# Patient Record
Sex: Female | Born: 1940 | Race: White | Hispanic: No | Smoking: Never smoker
Health system: Southern US, Community
[De-identification: ages and names within clinical notes are randomized; demographics above are authoritative.]

## PROBLEM LIST (undated history)

## (undated) DIAGNOSIS — I4891 Unspecified atrial fibrillation: Secondary | ICD-10-CM

## (undated) DIAGNOSIS — M47812 Spondylosis without myelopathy or radiculopathy, cervical region: Secondary | ICD-10-CM

## (undated) DIAGNOSIS — G4700 Insomnia, unspecified: Secondary | ICD-10-CM

## (undated) DIAGNOSIS — G250 Essential tremor: Secondary | ICD-10-CM

## (undated) DIAGNOSIS — M1990 Unspecified osteoarthritis, unspecified site: Secondary | ICD-10-CM

## (undated) DIAGNOSIS — K429 Umbilical hernia without obstruction or gangrene: Secondary | ICD-10-CM

## (undated) DIAGNOSIS — M797 Fibromyalgia: Secondary | ICD-10-CM

## (undated) DIAGNOSIS — M5030 Other cervical disc degeneration, unspecified cervical region: Secondary | ICD-10-CM

## (undated) DIAGNOSIS — I872 Venous insufficiency (chronic) (peripheral): Secondary | ICD-10-CM

## (undated) DIAGNOSIS — Z7901 Long term (current) use of anticoagulants: Secondary | ICD-10-CM

## (undated) DIAGNOSIS — I10 Essential (primary) hypertension: Secondary | ICD-10-CM

## (undated) HISTORY — DX: Fibromyalgia: M79.7

## (undated) HISTORY — PX: INGUINAL HERNIA REPAIR: SUR1180

## (undated) HISTORY — DX: Long term (current) use of anticoagulants: Z79.01

## (undated) HISTORY — DX: Other cervical disc degeneration, unspecified cervical region: M50.30

## (undated) HISTORY — DX: Insomnia, unspecified: G47.00

## (undated) HISTORY — DX: Venous insufficiency (chronic) (peripheral): I87.2

## (undated) HISTORY — DX: Essential (primary) hypertension: I10

## (undated) HISTORY — DX: Unspecified atrial fibrillation: I48.91

## (undated) HISTORY — DX: Spondylosis without myelopathy or radiculopathy, cervical region: M47.812

## (undated) HISTORY — DX: Umbilical hernia without obstruction or gangrene: K42.9

## (undated) HISTORY — PX: PARTIAL HYSTERECTOMY: SHX80

## (undated) HISTORY — DX: Essential tremor: G25.0

## (undated) HISTORY — DX: Morbid (severe) obesity due to excess calories: E66.01

## (undated) HISTORY — DX: Unspecified osteoarthritis, unspecified site: M19.90

---

## 2014-04-16 ENCOUNTER — Other Ambulatory Visit (HOSPITAL_COMMUNITY): Payer: Self-pay | Admitting: Radiology

## 2014-04-16 ENCOUNTER — Ambulatory Visit (HOSPITAL_COMMUNITY)
Admission: RE | Admit: 2014-04-16 | Discharge: 2014-04-16 | Disposition: A | Discharge status: Home / Self Care | Payer: Medicare (Managed Care) | Source: Ambulatory Visit | Attending: Family Medicine | Admitting: Family Medicine

## 2014-04-16 DIAGNOSIS — E662 Morbid (severe) obesity with alveolar hypoventilation: Secondary | ICD-10-CM

## 2014-04-16 DIAGNOSIS — IMO0002 Reserved for concepts with insufficient information to code with codable children: Secondary | ICD-10-CM

## 2014-04-16 DIAGNOSIS — I272 Other secondary pulmonary hypertension: Secondary | ICD-10-CM | POA: Insufficient documentation

## 2014-04-16 DIAGNOSIS — G4700 Insomnia, unspecified: Secondary | ICD-10-CM

## 2014-04-16 DIAGNOSIS — G4730 Sleep apnea, unspecified: Secondary | ICD-10-CM | POA: Diagnosis not present

## 2014-04-16 DIAGNOSIS — G4709 Other insomnia: Secondary | ICD-10-CM | POA: Diagnosis not present

## 2014-04-16 LAB — BLOOD GAS, ARTERIAL
Acid-Base Excess: 6.6 mmol/L — ABNORMAL HIGH (ref 0.0–2.0)
Bicarbonate: 30.7 mEq/L — ABNORMAL HIGH (ref 20.0–24.0)
Drawn by: 20517
FIO2: 0.21 %
O2 Saturation: 95 %
Patient temperature: 98.6
TCO2: 32 mmol/L (ref 0–100)
pCO2 arterial: 44.9 mmHg (ref 35.0–45.0)
pH, Arterial: 7.449 (ref 7.350–7.450)
pO2, Arterial: 72.3 mmHg — ABNORMAL LOW (ref 80.0–100.0)

## 2014-04-19 ENCOUNTER — Other Ambulatory Visit: Payer: Self-pay | Admitting: Family Medicine

## 2014-04-19 DIAGNOSIS — Z1231 Encounter for screening mammogram for malignant neoplasm of breast: Secondary | ICD-10-CM

## 2014-05-04 ENCOUNTER — Ambulatory Visit: Payer: Medicare (Managed Care)

## 2014-07-09 ENCOUNTER — Ambulatory Visit
Admission: RE | Admit: 2014-07-09 | Discharge: 2014-07-09 | Disposition: A | Discharge status: Home / Self Care | Payer: No Typology Code available for payment source | Source: Ambulatory Visit | Attending: Family Medicine | Admitting: Family Medicine

## 2014-07-09 DIAGNOSIS — Z1231 Encounter for screening mammogram for malignant neoplasm of breast: Secondary | ICD-10-CM

## 2015-05-17 ENCOUNTER — Ambulatory Visit (HOSPITAL_BASED_OUTPATIENT_CLINIC_OR_DEPARTMENT_OTHER): Payer: Medicare (Managed Care) | Attending: Nurse Practitioner | Admitting: Radiology

## 2015-05-17 VITALS — Ht 64.5 in | Wt 255.0 lb

## 2015-05-17 DIAGNOSIS — G4719 Other hypersomnia: Secondary | ICD-10-CM | POA: Insufficient documentation

## 2015-05-17 DIAGNOSIS — G4700 Insomnia, unspecified: Secondary | ICD-10-CM | POA: Diagnosis not present

## 2015-05-17 DIAGNOSIS — Z6841 Body Mass Index (BMI) 40.0 and over, adult: Secondary | ICD-10-CM | POA: Insufficient documentation

## 2015-05-17 DIAGNOSIS — E669 Obesity, unspecified: Secondary | ICD-10-CM | POA: Diagnosis not present

## 2015-05-17 DIAGNOSIS — R0683 Snoring: Secondary | ICD-10-CM | POA: Diagnosis not present

## 2015-05-17 DIAGNOSIS — Z79899 Other long term (current) drug therapy: Secondary | ICD-10-CM | POA: Diagnosis not present

## 2015-05-17 DIAGNOSIS — I493 Ventricular premature depolarization: Secondary | ICD-10-CM | POA: Insufficient documentation

## 2015-05-17 DIAGNOSIS — Z7901 Long term (current) use of anticoagulants: Secondary | ICD-10-CM | POA: Insufficient documentation

## 2015-05-22 DIAGNOSIS — G4700 Insomnia, unspecified: Secondary | ICD-10-CM | POA: Diagnosis not present

## 2015-05-22 NOTE — Progress Notes (Signed)
  Patient Name: Misty Buckley, Snow Study Date: 05/17/2015 Gender: Female D.O.B: 08-22-1940 Age (years): 74 Referring Provider: Estevan OaksBeverly Ann Brown Height (inches): 65 Interpreting Physician: Jetty Duhamellinton Young MD, ABSM Weight (lbs): 255 RPSGT: Ulyess MortSpruill, Vicki BMI: 43 MRN: [id number] Neck Size: 15.00 CLINICAL INFORMATION Sleep Study Type: NPSG Indication for sleep study: Excessive Daytime Sleepiness, Obesity, Snoring Epworth Sleepiness Score: 12  SLEEP STUDY TECHNIQUE As per the AASM Manual for the Scoring of Sleep and Associated Events v2.3 (April 2016) with a hypopnea requiring 4% desaturations. The channels recorded and monitored were frontal, central and occipital EEG, electrooculogram (EOG), submentalis EMG (chin), nasal and oral airflow, thoracic and abdominal wall motion, anterior tibialis EMG, snore microphone, electrocardiogram, and pulse oximetry.  MEDICATIONS Patient's medications include: CHARTED FOR REVIEW Medications self-administered by patient during sleep study : DULOXETINE, METOPROLOL, SIMVASTATIN, xarelto, MELATONIN.  SLEEP ARCHITECTURE The study was initiated at 10:32:56 PM and ended at 5:07:33 AM. Sleep onset time was 5.0 minutes and the sleep efficiency was 89.2%. The total sleep time was 352.0 minutes. Stage REM latency was 289.5 minutes. The patient spent 1.28% of the night in stage N1 sleep, 68.89% in stage N2 sleep, 4.83% in stage N3 and 25.00% in REM. Alpha intrusion was absent. Supine sleep was 49.01%.  RESPIRATORY PARAMETERS The overall apnea/hypopnea index (AHI) was 2.9 per hour. There were 13 total apneas, including 13 obstructive, 0 central and 0 mixed apneas. There were 4 hypopneas and 50 RERAs. The AHI during Stage REM sleep was 1.4 per hour. AHI while supine was 4.5 per hour. The mean oxygen saturation was 97.47%. The minimum SpO2 during sleep was 92.00%.  Studied on O2 3L as at home, per protocol Soft snoring was noted during this study.  CARDIAC  DATA The 2 lead EKG demonstrated sinus rhythm. The mean heart rate was 78.01 beats per minute. Other EKG findings include: None.  LEG MOVEMENT DATA The total PLMS were 0 with a resulting PLMS index of 0.00. Associated arousal with leg movement index was 0.0 .  IMPRESSIONS - No significant obstructive sleep apnea occurred during this study (AHI = 2.9/h). - No significant central sleep apnea occurred during this study (CAI = 0.0/h). - The patient had minimal or no oxygen desaturation during the study (Min O2 = 92.00%), wearing O2 3L as at home - The patient snored with Soft snoring volume. - No cardiac abnormalities were noted during this study. Occasional PVC - Clinically significant periodic limb movements did not occur during sleep. No significant associated arousals. - Patient slept in recliner chair similar to home use of lift chair with head elevated.  DIAGNOSIS - Normal study  RECOMMENDATIONS - Avoid alcohol, sedatives and other CNS depressants that may worsen sleep apnea and disrupt normal sleep architecture. - Sleep hygiene should be reviewed to assess factors that may improve sleep quality. - Weight management and regular exercise should be initiated or continued if appropriate.  Waymon BudgeYOUNG,CLINTON D Diplomate, American Board of Sleep Medicine  ELECTRONICALLY SIGNED ON:  05/22/2015, 3:11 PM [facility] PH: [phone]   FX: [phone] ACCREDITED BY THE AMERICAN ACADEMY OF SLEEP MEDICINE

## 2015-06-10 ENCOUNTER — Ambulatory Visit (INDEPENDENT_AMBULATORY_CARE_PROVIDER_SITE_OTHER): Payer: Medicare (Managed Care) | Admitting: Internal Medicine

## 2015-06-10 ENCOUNTER — Encounter: Payer: Self-pay | Admitting: Internal Medicine

## 2015-06-10 ENCOUNTER — Encounter (HOSPITAL_COMMUNITY): Payer: Self-pay

## 2015-06-10 ENCOUNTER — Telehealth: Payer: Self-pay

## 2015-06-10 ENCOUNTER — Inpatient Hospital Stay (HOSPITAL_COMMUNITY)
Admission: AD | Admit: 2015-06-10 | Discharge: 2015-06-12 | DRG: 394 | Disposition: A | Discharge status: Skilled Nursing Facility | Payer: Medicare (Managed Care) | Source: Ambulatory Visit | Attending: Oncology | Admitting: Oncology

## 2015-06-10 VITALS — BP 114/60 | HR 104

## 2015-06-10 DIAGNOSIS — M797 Fibromyalgia: Secondary | ICD-10-CM | POA: Diagnosis present

## 2015-06-10 DIAGNOSIS — F419 Anxiety disorder, unspecified: Secondary | ICD-10-CM | POA: Diagnosis present

## 2015-06-10 DIAGNOSIS — I83009 Varicose veins of unspecified lower extremity with ulcer of unspecified site: Secondary | ICD-10-CM | POA: Diagnosis present

## 2015-06-10 DIAGNOSIS — I10 Essential (primary) hypertension: Secondary | ICD-10-CM | POA: Diagnosis not present

## 2015-06-10 DIAGNOSIS — K449 Diaphragmatic hernia without obstruction or gangrene: Secondary | ICD-10-CM | POA: Diagnosis present

## 2015-06-10 DIAGNOSIS — K649 Unspecified hemorrhoids: Secondary | ICD-10-CM | POA: Diagnosis not present

## 2015-06-10 DIAGNOSIS — I480 Paroxysmal atrial fibrillation: Secondary | ICD-10-CM | POA: Diagnosis present

## 2015-06-10 DIAGNOSIS — I5032 Chronic diastolic (congestive) heart failure: Secondary | ICD-10-CM | POA: Diagnosis present

## 2015-06-10 DIAGNOSIS — K625 Hemorrhage of anus and rectum: Secondary | ICD-10-CM | POA: Diagnosis present

## 2015-06-10 DIAGNOSIS — E785 Hyperlipidemia, unspecified: Secondary | ICD-10-CM | POA: Diagnosis present

## 2015-06-10 DIAGNOSIS — G4700 Insomnia, unspecified: Secondary | ICD-10-CM | POA: Diagnosis present

## 2015-06-10 DIAGNOSIS — K429 Umbilical hernia without obstruction or gangrene: Secondary | ICD-10-CM | POA: Diagnosis present

## 2015-06-10 DIAGNOSIS — Z79899 Other long term (current) drug therapy: Secondary | ICD-10-CM

## 2015-06-10 DIAGNOSIS — I110 Hypertensive heart disease with heart failure: Secondary | ICD-10-CM | POA: Diagnosis present

## 2015-06-10 DIAGNOSIS — R32 Unspecified urinary incontinence: Secondary | ICD-10-CM | POA: Diagnosis present

## 2015-06-10 DIAGNOSIS — I872 Venous insufficiency (chronic) (peripheral): Secondary | ICD-10-CM | POA: Diagnosis present

## 2015-06-10 DIAGNOSIS — Z9981 Dependence on supplemental oxygen: Secondary | ICD-10-CM

## 2015-06-10 DIAGNOSIS — K921 Melena: Secondary | ICD-10-CM | POA: Diagnosis not present

## 2015-06-10 DIAGNOSIS — Z888 Allergy status to other drugs, medicaments and biological substances status: Secondary | ICD-10-CM | POA: Diagnosis not present

## 2015-06-10 DIAGNOSIS — K648 Other hemorrhoids: Principal | ICD-10-CM | POA: Diagnosis present

## 2015-06-10 DIAGNOSIS — F329 Major depressive disorder, single episode, unspecified: Secondary | ICD-10-CM | POA: Diagnosis present

## 2015-06-10 DIAGNOSIS — Z7901 Long term (current) use of anticoagulants: Secondary | ICD-10-CM

## 2015-06-10 DIAGNOSIS — I4891 Unspecified atrial fibrillation: Secondary | ICD-10-CM | POA: Diagnosis present

## 2015-06-10 DIAGNOSIS — I071 Rheumatic tricuspid insufficiency: Secondary | ICD-10-CM | POA: Diagnosis present

## 2015-06-10 DIAGNOSIS — K5791 Diverticulosis of intestine, part unspecified, without perforation or abscess with bleeding: Secondary | ICD-10-CM | POA: Diagnosis not present

## 2015-06-10 DIAGNOSIS — I272 Other secondary pulmonary hypertension: Secondary | ICD-10-CM | POA: Diagnosis present

## 2015-06-10 DIAGNOSIS — K5730 Diverticulosis of large intestine without perforation or abscess without bleeding: Secondary | ICD-10-CM | POA: Diagnosis present

## 2015-06-10 DIAGNOSIS — G4730 Sleep apnea, unspecified: Secondary | ICD-10-CM | POA: Diagnosis present

## 2015-06-10 DIAGNOSIS — G250 Essential tremor: Secondary | ICD-10-CM | POA: Diagnosis present

## 2015-06-10 DIAGNOSIS — L97819 Non-pressure chronic ulcer of other part of right lower leg with unspecified severity: Secondary | ICD-10-CM | POA: Diagnosis present

## 2015-06-10 DIAGNOSIS — L8990 Pressure ulcer of unspecified site, unspecified stage: Secondary | ICD-10-CM | POA: Diagnosis present

## 2015-06-10 LAB — COMPREHENSIVE METABOLIC PANEL
ALBUMIN: 3.3 g/dL — AB (ref 3.5–5.0)
ALT: 11 U/L — ABNORMAL LOW (ref 14–54)
AST: 22 U/L (ref 15–41)
Alkaline Phosphatase: 73 U/L (ref 38–126)
Anion gap: 9 (ref 5–15)
BUN: 10 mg/dL (ref 6–20)
CHLORIDE: 96 mmol/L — AB (ref 101–111)
CO2: 35 mmol/L — ABNORMAL HIGH (ref 22–32)
Calcium: 9 mg/dL (ref 8.9–10.3)
Creatinine, Ser: 0.86 mg/dL (ref 0.44–1.00)
GFR calc Af Amer: >60 mL/min (ref >60)
Glucose, Bld: 114 mg/dL — ABNORMAL HIGH (ref 65–99)
POTASSIUM: 4 mmol/L (ref 3.5–5.1)
Sodium: 140 mmol/L (ref 135–145)
Total Bilirubin: 1 mg/dL (ref 0.3–1.2)
Total Protein: 6.6 g/dL (ref 6.5–8.1)

## 2015-06-10 LAB — CBC
HEMATOCRIT: 45.3 % (ref 36.0–46.0)
Hemoglobin: 13.5 g/dL (ref 12.0–15.0)
MCH: 29.6 pg (ref 26.0–34.0)
MCHC: 29.8 g/dL — AB (ref 30.0–36.0)
MCV: 99.3 fL (ref 78.0–100.0)
Platelets: 128 10*3/uL — ABNORMAL LOW (ref 150–400)
RBC: 4.56 MIL/uL (ref 3.87–5.11)
RDW: 14.1 % (ref 11.5–15.5)
WBC: 11.2 10*3/uL — ABNORMAL HIGH (ref 4.0–10.5)

## 2015-06-10 LAB — ABO/RH: ABO/RH(D): A POS

## 2015-06-10 LAB — PREPARE RBC (CROSSMATCH)

## 2015-06-10 LAB — PROTIME-INR
INR: 1.45 (ref 0.00–1.49)
Prothrombin Time: 17.7 seconds — ABNORMAL HIGH (ref 11.6–15.2)

## 2015-06-10 MED ORDER — POTASSIUM CHLORIDE 20 MEQ PO PACK
20.0000 meq | PACK | Freq: Two times a day (BID) | ORAL | Status: DC
  Administered 2015-06-10 – 2015-06-11 (×3): 20 meq via ORAL
  Filled 2015-06-10 (×4): qty 1

## 2015-06-10 MED ORDER — MELATONIN 5 MG PO TABS: 1.0000 | ORAL_TABLET | Freq: Every day | ORAL | Status: DC

## 2015-06-10 MED ORDER — FUROSEMIDE 80 MG PO TABS
80.0000 mg | ORAL_TABLET | Freq: Two times a day (BID) | ORAL | Status: DC
  Administered 2015-06-11 – 2015-06-12 (×3): 80 mg via ORAL
  Filled 2015-06-10 (×3): qty 1

## 2015-06-10 MED ORDER — POLYETHYLENE GLYCOL 3350 17 G PO PACK
17.0000 g | PACK | Freq: Every day | ORAL | Status: DC
  Administered 2015-06-10: 17 g via ORAL
  Filled 2015-06-10: qty 1

## 2015-06-10 MED ORDER — BENEFIBER PO POWD: 1.0000 | Freq: Every day | ORAL | Status: DC

## 2015-06-10 MED ORDER — PEG-KCL-NACL-NASULF-NA ASC-C 100 G PO SOLR
0.5000 | Freq: Once | ORAL | Status: AC
  Administered 2015-06-10: 100 g via ORAL
  Filled 2015-06-10: qty 1

## 2015-06-10 MED ORDER — PEG-KCL-NACL-NASULF-NA ASC-C 100 G PO SOLR
0.5000 | Freq: Once | ORAL | Status: AC
  Administered 2015-06-11: 100 g via ORAL
  Filled 2015-06-10: qty 1

## 2015-06-10 MED ORDER — PANTOPRAZOLE SODIUM 40 MG IV SOLR
40.0000 mg | Freq: Two times a day (BID) | INTRAVENOUS | Status: DC
  Administered 2015-06-10 – 2015-06-12 (×4): 40 mg via INTRAVENOUS
  Filled 2015-06-10 (×4): qty 40

## 2015-06-10 MED ORDER — PEG-KCL-NACL-NASULF-NA ASC-C 100 G PO SOLR: 1.0000 | Freq: Once | ORAL | Status: DC

## 2015-06-10 MED ORDER — CALCIUM CARBONATE-VITAMIN D 500-200 MG-UNIT PO TABS
1.0000 | ORAL_TABLET | Freq: Every day | ORAL | Status: DC
  Administered 2015-06-11 – 2015-06-12 (×2): 1 via ORAL
  Filled 2015-06-10 (×2): qty 1

## 2015-06-10 MED ORDER — METOPROLOL SUCCINATE ER 50 MG PO TB24
50.0000 mg | ORAL_TABLET | Freq: Every day | ORAL | Status: DC
  Administered 2015-06-11 – 2015-06-12 (×2): 50 mg via ORAL
  Filled 2015-06-10 (×2): qty 1

## 2015-06-10 MED ORDER — DULOXETINE HCL 30 MG PO CPEP
30.0000 mg | ORAL_CAPSULE | Freq: Every day | ORAL | Status: DC
  Administered 2015-06-11 – 2015-06-12 (×2): 30 mg via ORAL
  Filled 2015-06-10 (×2): qty 1

## 2015-06-10 MED ORDER — HYDROCODONE-ACETAMINOPHEN 5-325 MG PO TABS
1.0000 | ORAL_TABLET | Freq: Four times a day (QID) | ORAL | Status: DC | PRN
  Administered 2015-06-11 – 2015-06-12 (×2): 1 via ORAL
  Filled 2015-06-10 (×2): qty 1

## 2015-06-10 MED ORDER — ESTRADIOL 0.1 MG/GM VA CREA
1.0000 | TOPICAL_CREAM | Freq: Every day | VAGINAL | Status: DC
  Administered 2015-06-10 – 2015-06-11 (×2): 1 via VAGINAL
  Filled 2015-06-10: qty 42.5

## 2015-06-10 MED ORDER — METOLAZONE 2.5 MG PO TABS
2.5000 mg | ORAL_TABLET | Freq: Every day | ORAL | Status: DC
  Administered 2015-06-11 – 2015-06-12 (×2): 2.5 mg via ORAL
  Filled 2015-06-10 (×4): qty 1

## 2015-06-10 MED ORDER — SODIUM CHLORIDE 0.9% FLUSH
3.0000 mL | Freq: Two times a day (BID) | INTRAVENOUS | Status: DC
  Administered 2015-06-10 – 2015-06-12 (×4): 3 mL via INTRAVENOUS

## 2015-06-10 MED ORDER — ADULT MULTIVITAMIN W/MINERALS CH
1.0000 | ORAL_TABLET | Freq: Every day | ORAL | Status: DC
  Administered 2015-06-11 – 2015-06-12 (×2): 1 via ORAL
  Filled 2015-06-10 (×3): qty 1

## 2015-06-10 MED ORDER — CALCIUM CITRATE-VITAMIN D 200-250 MG-UNIT PO TABS: 1.0000 | ORAL_TABLET | Freq: Every day | ORAL | Status: DC

## 2015-06-10 MED ORDER — SIMVASTATIN 20 MG PO TABS
20.0000 mg | ORAL_TABLET | Freq: Every day | ORAL | Status: DC
  Administered 2015-06-10 – 2015-06-11 (×2): 20 mg via ORAL
  Filled 2015-06-10 (×2): qty 1

## 2015-06-10 MED ORDER — ALENDRONATE SODIUM 70 MG PO TABS: 70.0000 mg | ORAL_TABLET | ORAL | Status: DC

## 2015-06-10 MED ORDER — ALPRAZOLAM 0.5 MG PO TABS: 0.5000 mg | ORAL_TABLET | Freq: Every evening | ORAL | Status: DC | PRN

## 2015-06-10 MED ORDER — CETYLPYRIDINIUM CHLORIDE 0.05 % MT LIQD
7.0000 mL | Freq: Two times a day (BID) | OROMUCOSAL | Status: DC
  Administered 2015-06-10 – 2015-06-11 (×3): 7 mL via OROMUCOSAL

## 2015-06-10 MED ORDER — SENNA 8.6 MG PO TABS: 2.0000 | ORAL_TABLET | Freq: Every day | ORAL | Status: DC | PRN

## 2015-06-10 NOTE — Patient Instructions (Signed)
[facility] while awaiting a bed for direct admission by Dr. Joseph ArtWoods.

## 2015-06-10 NOTE — Telephone Encounter (Signed)
Per Dr. Adine MaduraKohler's office pt was seen today in their office. States colon may need to be diagnostic. Pt has constipation and told them she had been passing blood clots since the first of the week. He is stopping her xeralto and vesicare today. State they will send and updated med list with her to the appt today. Dr. Marina GoodellPerry aware.

## 2015-06-10 NOTE — Progress Notes (Signed)
HISTORY OF PRESENT ILLNESS:  Misty Buckley is a 75 y.o. 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 The patient is new to this office and sent this afternoon by her PCP Dr[facility] Dor[facility]hler at [facility],  with new onset rectal bleeding this week. She describes significant red blood and clots. She has had some generalized lower abdominal discomfort. Some nausea but no vomiting. She does report chronic GERD but is not on PPI. Multiple medications as listed. Saw her PCP today. Encounter reviewed. Labs reviewed. Rectal exam at that time revealed red blood clots. Her xeralto was stopped. Evaluation in this office this afternoon arranged. Outside laboratories from earlier today have been received and reviewed. Blood cell count 14.1, hemoglobin 13.3, platelets 141,000. The patient's thinks that she may have had prior endoscopic procedures, but if so this has been decades and there are no details.  REVIEW OF SYSTEMS:  All non-GI ROS negative except for shortness of breath, back pain, hip pain, arthritis, lower extremity edema, neck pain,  Past Medical History  Diagnosis Date  . Morbid obesity (HCC)   . Hypertension   . A-fib (HCC)   . Chronic anticoagulation   . Osteoarthritis   . Cervical  spondylosis   . DDD (degenerative disc disease), cervical   . Fibromyalgia   . Chronic venous insufficiency   . Umbilical hernia   . Benign essential tremor   . Insomnia     Past Surgical History  Procedure Laterality Date  . Partial hysterectomy    . Inguinal hernia repair Right     Social History Misty Humanthel M Totherow  reports that she has never smoked. She has never used smokeless tobacco. She reports that she does not drink alcohol or use illicit drugs.  family history includes Diabetes in her mother; Heart attack in her brother; Heart attack (age of onset: 6557) in her father.  Allergies  Allergen Reactions  . Risperidone And Related        PHYSICAL EXAMINATION: Vital signs: BP 114/60 mmHg  Pulse 104  Ht   Wt   Constitutional: Pleasant, markedly obese, chronically ill and unhealthy appearing, no acute distress but extremely limited mobility Psychiatric: alert and oriented x3, cooperative but intermittent problems with memory recall Eyes: extraocular movements intact, anicteric, conjunctiva pink Mouth: oral pharynx moist, no lesions Neck: supple but thick Lymph: no lymphadenopathy Cardiovascular: heart regular rate and rhythm, soft systolic with distant breath sounds murmur Lungs: clear to auscultation bilaterally Abdomen: soft, markedly obese, nontender, nondistended, no obvious ascites, no peritoneal signs, normal bowel sounds, no organomegaly. Umbilical hernia Rectal: No external abnormalities. No tenderness. No fissure. No internal mass. Cherry red blood on the examining finger Extremities: No clubbing or cyanosis. Bilateral lower extremities are wrapped with bandages with obvious woody edema Skin: no relevant lesions on poor so or upper extremities. Lower extremities as described Neuro: No focal deficits. Cranial nerves intact. No asterixis.  ASSESSMENT:  #1. Rectal bleeding on chronic anticoagulation therapy. Etiology unclear. Hemodynamically stable. Hemoglobin  earlier  today acceptable #2. History of atrial fibrillation on Xarelto. Just stopped #3. MULTIPLE MULTIPLE significant medical problems. Impossibility to workup her rectal bleeding as an outpatient. She is high risk for procedural work under the best of circumstances. Needs hospitalized for further assessment and evaluation   PLAN:  #1. Admit to the hospital. Dr. Carolyne Littles has kindly agreed to facilitate her admission [facility] #2. GI aware of patient (Dr. Adela Lank). Will need evaluation. May be best to start with flexible sigmoidoscopy to minimize risk.  Discussed with patient. Message left with PCP Dr. Dorothe Pea

## 2015-06-10 NOTE — Telephone Encounter (Signed)
-----   Message from Jessee AversAmanda L Lewellyn, [place] sent at 06/10/2015 11:57 AM EDT -----   ----- Message -----    From: Holli Humbleshristy D Harris    Sent: 06/10/2015  11:47 AM      To: Jessee AversAmanda L Lewellyn, CMA  Pt has an appt today @ 3:00 with Dr. Rossie MuskratPerry  Jackie @ Dr. Malon KindleKoehler's office called and wants to discuss before her appt her symptoms  #[phone]

## 2015-06-10 NOTE — Progress Notes (Signed)
Pt. Has arrived to, and is being orieinted to [address]. Pt. BP, pulse and temperature within normal limits. Oxygen was low at 77%, pt. Placed on 2L nasal cannula and oxygen saturations are now at 93%.

## 2015-06-10 NOTE — H&P (Addendum)
[facility] History and Physical  Misty Buckley ZOX:[id number] DOB: [birth year] DOA: 06/10/2015  Referring physician: Dr Yancey Flemings PCP: Thane Edu, MD   Chief Complaint:   rectal bleed x 2 weeks  HPI:  75 year old morbidly obese female with multiple comorbidities (obtained from patient's sister and Dr. Lamar Sprinkles note) which includes pulmonary hypertension on intermittent oxygen, exertional dyspnea, left diaphragmatic hernia, hypertension, diastolic CHF, tricuspid insufficiency, hyperlipidemia, paroxysmal A. fib on Xarelto, osteoarthritis, cervical spondylosis with radiculopathy,, chronic venous insufficiency with lymphedema and recurrent cellulitis, venous stasis ulcers on right shin, urinary incontinence, umbilical hernia who was referred by the PCP at [facility] program to rule out GI for ongoing rectal bleed for 2 weeks. As per patient she has been constipated off and on for 2 weeks but having bright red blood per rectum, occasionally with clots on a regular basis for past 2 weeks. She has been continuing anticoagulation. Patient also having generalized dull lower abdominal discomfort. Denies any headache, dizziness, syncope, fever, chills, nausea, vomiting, chest pain, palpitations, worsening shortness of breath, dysuria. Denies use of NSAIDs over-the-counter. Denies any new medications. No change in weight or appetite. Patient lives alone and ambulates with the help of a walker. Patient was hospitalized in January for cellulitis at [facility] and was admitted to rehabilitation at [facility] early this month by her PCP for generalized weakness.  Rectal exam done in  the office showed red blood clots.  Dr. Marina Goodell requested a hospitalist to have patient directly admitted given her rectal bleed and multiple comorbidities to have inpatient workup done. Per his note patient had lab work done at PCPs office today which showed WC of 14.1 K, Reglan 13.3 and platelets of  141K. I do not have any other lab work or notes available.   Review of Systems:  As outlined in history of present illness. 12 point review of systems otherwise unremarkable.    Past Medical History  Diagnosis Date  . Morbid obesity (HCC)   . Hypertension   . A-fib (HCC)   . Chronic anticoagulation   . Osteoarthritis   . Cervical spondylosis   . DDD (degenerative disc disease), cervical   . Fibromyalgia   . Chronic venous insufficiency   . Umbilical hernia   . Benign essential tremor   . Insomnia    Past Surgical History  Procedure Laterality Date  . Partial hysterectomy    . Inguinal hernia repair Right    Social History:  reports that she has never smoked. She has never used smokeless tobacco. She reports that she does not drink alcohol or use illicit drugs.  Allergies  Allergen Reactions  . Risperidone And Related     Family History  Problem Relation Age of Onset  . Heart attack Father 61    deceased  . Heart attack Brother   . Diabetes Mother     Prior to Admission medications   Medication Sig Start Date End Date Taking? Authorizing Provider  alendronate (FOSAMAX) 70 MG tablet Take 70 mg by mouth once a week. Take with a full glass of water on an empty stomach.    Historical Provider, MD  ALPRAZolam Prudy Feeler) 0.5 MG tablet Take 0.5 mg by mouth at bedtime as needed for anxiety.    Historical Provider, MD  bag balm OINT ointment Apply 1 application topically as needed for dry skin.    Historical Provider, MD  Calcium Citrate-Vitamin D (CITRACAL PETITES/VITAMIN D PO) Take by mouth.    Historical Provider, MD  DULoxetine (CYMBALTA) 30 MG capsule Take 30 mg by mouth daily.    Historical Provider, MD  DULoxetine (CYMBALTA) 60 MG capsule Take 60 mg by mouth daily.    Historical Provider, MD  estradiol (ESTRACE) 0.1 MG/GM vaginal cream Place 1 Applicatorful vaginally at bedtime.    Historical Provider, MD  fluocinonide cream (LIDEX) 0.05 % Apply 1 application topically 2  (two) times daily.    Historical Provider, MD  furosemide (LASIX) 80 MG tablet Take 80 mg by mouth 2 (two) times daily.    Historical Provider, MD  HYDROcodone-acetaminophen (NORCO/VICODIN) 5-325 MG tablet Take 1 tablet by mouth every 6 (six) hours as needed for moderate pain. 1 tab in the am, 1/2 at noon, 1/2 in pm, 1/2 at bedtime    Historical Provider, MD  Melatonin 5 MG TABS Take 1 tablet by mouth daily.    Historical Provider, MD  metolazone (ZAROXOLYN) 2.5 MG tablet Take 2.5 mg by mouth daily.    Historical Provider, MD  metoprolol succinate (TOPROL-XL) 50 MG 24 hr tablet Take 50 mg by mouth daily. Take with or immediately following a meal.    Historical Provider, MD  Multiple Vitamin (MULTIVITAMIN) tablet Take 1 tablet by mouth daily.    Historical Provider, MD  OXYGEN Inhale 2 L into the lungs as needed.    Historical Provider, MD  potassium chloride (KLOR-CON) 20 MEQ packet Take 20 mEq by mouth 2 (two) times daily.    Historical Provider, MD  senna (SENOKOT) 8.6 MG TABS tablet Take 2 tablets by mouth.    Historical Provider, MD  simvastatin (ZOCOR) 20 MG tablet Take 20 mg by mouth daily.    Historical Provider, MD  Wheat Dextrin (BENEFIBER PO) Take by mouth.    Historical Provider, MD     Physical Exam:  There were no vitals filed for this visit.  Constitutional: Vital signs reviewed.  Lili obese female appears fatigued, not in distress. HEENT:Pallor present,o icterus, moist oral mucosa, no cervical lymphadenopathy, supple neck  Cardiovascular: RRR, S1 normal, S2 normal, no MRG Chest: CTAB, no wheezes, rales, or rhonchi Abdominal: Soft. Non-tender, non-distended, bowel sounds are normal Musculoskeletal: Ace wrap over bilateral legs CNS: Alert and oriented    Labs on Admission:   CBC, comprehensive metabolic panel, INR, type and screen ordered  EKG: ordered   Assessment/Plan  Principal problem Rectal bleed Hold anticoagulation. Check CBC, INR. Type and screen. Monitor on  telemetry  IV PPI twice a day. Avoid NSAIDs. Spoke with Dr Kathyrn DrownArmbrewster. Recommended to order bowel prep for possible procedure tomorrow... Place on clear liquid and nothing by mouth after midnight.   Active Problems: Atrial fibrillation Continue cardiac monitor. Hold Xarelto. Rate control with metoprolol.  Chronic diastolic CHF Appears euvolemic. Continue home dose Lasix, metolazone and beta blocker. Continue statin  Chronic venous insufficiency History of recurrent cellulitis. Wound care consult.  Essential hypertension Continue Lasix, beta blocker  Anxiety and depression Continue Xanax when necessary and Cymbalta.    history of fibromyalgia Continue when necessary Vicodin and Cymbalta    [facility] with [facility]. I have informed the internal medicine teaching service resident. Patient will be taken over to their service tomorrow morning.   Diet:clears , NPO after midnight  DVT prophylaxis: SCD   Code Status: full code Family Communication:  Sister at bedside Disposition Plan: admit to tele  Eddie NorthDHUNGEL, Isaic Syler [facility] Pager [phone]  Total time spent on admission :70 minutes  If 7PM-7AM, please contact night-coverage [email or web address] Password [email or web address] 06/10/2015,  5:35 PM

## 2015-06-11 DIAGNOSIS — F418 Other specified anxiety disorders: Secondary | ICD-10-CM

## 2015-06-11 DIAGNOSIS — L8990 Pressure ulcer of unspecified site, unspecified stage: Secondary | ICD-10-CM | POA: Diagnosis present

## 2015-06-11 DIAGNOSIS — I5032 Chronic diastolic (congestive) heart failure: Secondary | ICD-10-CM

## 2015-06-11 DIAGNOSIS — I272 Other secondary pulmonary hypertension: Secondary | ICD-10-CM

## 2015-06-11 DIAGNOSIS — K625 Hemorrhage of anus and rectum: Secondary | ICD-10-CM

## 2015-06-11 LAB — BASIC METABOLIC PANEL
Anion gap: 7 (ref 5–15)
BUN: 6 mg/dL (ref 6–20)
CHLORIDE: 95 mmol/L — AB (ref 101–111)
CO2: 36 mmol/L — ABNORMAL HIGH (ref 22–32)
CREATININE: 0.84 mg/dL (ref 0.44–1.00)
Calcium: 8.4 mg/dL — ABNORMAL LOW (ref 8.9–10.3)
GFR calc non Af Amer: >60 mL/min (ref >60)
Glucose, Bld: 104 mg/dL — ABNORMAL HIGH (ref 65–99)
POTASSIUM: 3.5 mmol/L (ref 3.5–5.1)
SODIUM: 138 mmol/L (ref 135–145)

## 2015-06-11 LAB — CBC
HCT: 44.2 % (ref 36.0–46.0)
Hemoglobin: 12.9 g/dL (ref 12.0–15.0)
MCH: 29 pg (ref 26.0–34.0)
MCHC: 29.2 g/dL — AB (ref 30.0–36.0)
MCV: 99.3 fL (ref 78.0–100.0)
Platelets: 128 10*3/uL — ABNORMAL LOW (ref 150–400)
RBC: 4.45 MIL/uL (ref 3.87–5.11)
RDW: 14.1 % (ref 11.5–15.5)
WBC: 10.3 10*3/uL (ref 4.0–10.5)

## 2015-06-11 MED ORDER — PEG-KCL-NACL-NASULF-NA ASC-C 100 G PO SOLR: 0.5000 | Freq: Once | ORAL | Status: AC

## 2015-06-11 MED ORDER — PEG-KCL-NACL-NASULF-NA ASC-C 100 G PO SOLR
0.5000 | Freq: Once | ORAL | Status: AC
  Administered 2015-06-12: 100 g via ORAL
  Filled 2015-06-11: qty 1

## 2015-06-11 MED ORDER — PEG-KCL-NACL-NASULF-NA ASC-C 100 G PO SOLR
0.5000 | Freq: Once | ORAL | Status: AC
  Filled 2015-06-11: qty 1

## 2015-06-11 MED ORDER — PEG-KCL-NACL-NASULF-NA ASC-C 100 G PO SOLR: 1.0000 | Freq: Once | ORAL | Status: DC

## 2015-06-11 MED ORDER — PEG-KCL-NACL-NASULF-NA ASC-C 100 G PO SOLR
0.5000 | Freq: Once | ORAL | Status: AC
  Administered 2015-06-11: 100 g via ORAL
  Filled 2015-06-11: qty 1

## 2015-06-11 MED ORDER — POTASSIUM CHLORIDE CRYS ER 20 MEQ PO TBCR
20.0000 meq | EXTENDED_RELEASE_TABLET | Freq: Two times a day (BID) | ORAL | Status: DC
  Administered 2015-06-12: 20 meq via ORAL
  Filled 2015-06-11: qty 1

## 2015-06-11 MED ORDER — DOUBLE ANTIBIOTIC 500-10000 UNIT/GM EX OINT
TOPICAL_OINTMENT | Freq: Two times a day (BID) | CUTANEOUS | Status: DC
  Administered 2015-06-11: 1 via TOPICAL
  Filled 2015-06-11 (×17): qty 1

## 2015-06-11 NOTE — Progress Notes (Signed)
New area of redness noted on left hip and buttock with open area where patient says she "picked a scab off." MD notified and asked to come assess. Will continue to monitor.

## 2015-06-11 NOTE — Discharge Summary (Signed)
Name: Misty Buckley, Misty Buckley 75 y.o. PCP: Jethro Bastos, MD  Date of Admission: 06/10/2015  4:59 PM Date of Discharge: 06/12/2015 Attending Physician: Levert Feinstein, MD  Discharge Diagnosis: 1. Hematochezia 2. Paroxysmal atrial fibrillation 3. Chronic venous insufficiency   Discharge Medications:   Medication List    TAKE these medications        alendronate 70 MG tablet  Commonly known as:  FOSAMAX  Take 70 mg by mouth once a week. Take with a full glass of water on an empty stomach - on Sundays     ALPRAZolam 0.5 MG tablet  Commonly known as:  XANAX  Take 0.5 mg by mouth at bedtime.     bag balm Oint ointment  Apply 1 application topically 2 (two) times daily.     BENEFIBER PO  Take 4 g by mouth 2 (two) times daily as needed (constipation).     CITRACAL PETITES/VITAMIN D 200-250 MG-UNIT Tabs  Generic drug:  Calcium Citrate-Vitamin D  Take 1 tablet by mouth daily.     DULoxetine 30 MG capsule  Commonly known as:  CYMBALTA  Take 30 mg by mouth at bedtime. Also take 60 mg tablet every morning     estradiol 0.1 MG/GM vaginal cream  Commonly known as:  ESTRACE  Place 1 Applicatorful vaginally at bedtime.     fluocinonide cream 0.05 %  Commonly known as:  LIDEX  Apply 1 application topically 2 (two) times daily.     furosemide 80 MG tablet  Commonly known as:  LASIX  Take 80 mg by mouth 2 (two) times daily. Morning and afternoon     HYDROcodone-acetaminophen 5-325 MG tablet  Commonly known as:  NORCO/VICODIN  Take 0.5-1 tablets by mouth 4 (four) times daily. Take 1 tablet by mouth every morning, take 1/2 tablet at noon, in the evening and at bedtime     hydrocortisone 25 MG suppository  Commonly known as:  ANUSOL-HC  Place 1 suppository (25 mg total) rectally 2 (two) times daily.     LUBRIDERM Lotn  Apply 1 application topically 2 (two) times daily.     Melatonin 5 MG Tabs  Take 5 mg by mouth at bedtime.     metolazone 2.5  MG tablet  Commonly known as:  ZAROXOLYN  Take 2.5 mg by mouth 2 (two) times a week. Monday and Thursday     metoprolol succinate 50 MG 24 hr tablet  Commonly known as:  TOPROL-XL  Take 25 mg by mouth 2 (two) times daily. Take with or immediately following a meal.     multivitamin with minerals Tabs tablet  Take 1 tablet by mouth daily.     OXYGEN  Inhale 2 L into the lungs continuous.     potassium chloride SA 20 MEQ tablet  Commonly known as:  K-DUR,KLOR-CON  Take 20 mEq by mouth daily.     rivaroxaban 20 MG Tabs tablet  Commonly known as:  XARELTO  Take 20 mg by mouth daily with supper.     SENNA-S 8.6-50 MG tablet  Generic drug:  senna-docusate  Take 2 tablets by mouth daily.     simvastatin 20 MG tablet  Commonly known as:  ZOCOR  Take 20 mg by mouth at bedtime.        Disposition and follow-up:   Misty Buckley was discharged from [facility] in Good condition.  At the hospital follow up visit please address:  1.  If she is having recurrent hemorrhoidal bleeding on rivaroxaban, consider changing her rivaroxaban to warfarin or perhaps consulting general surgery to remove hemorrhoids  Consultations:  Treatment Team:  Ruffin Frederick, MD  Procedures Performed:  Colonoscopy: Scattered medium-mouthed diverticula were found in the entire colon. Non-bleeding internal hemorrhoids were found. The hemorrhoids were large but no active bleeding was Noted. The exam was otherwise normal throughout the examined colon. No polyps or mass lesions were noted however the bowel preparation was only fair and significant lavage was needed for adequate views. No polyps were appreciated, however small or flat polyps may not have been appreciated in dependant portions of the colon. There was liquid stool throughout the colon but no blood appreciated. - Diverticulosis in the entire examined colon. - Non-bleeding internal hemorrhoids which are large and I suspect  are the most likely cause of the patient's symptoms.  Admission HPI: 75 year old morbidly obese female with multiple comorbidities (obtained from patient's sister and Dr. Lamar Sprinkles note) which includes pulmonary hypertension on intermittent oxygen, exertional dyspnea, left diaphragmatic hernia, hypertension, diastolic CHF, tricuspid insufficiency, hyperlipidemia, paroxysmal A. fib on Xarelto, osteoarthritis, cervical spondylosis with radiculopathy,, chronic venous insufficiency with lymphedema and recurrent cellulitis, venous stasis ulcers on right shin, urinary incontinence, umbilical hernia who was referred by the PCP at [facility] program to rule out GI for ongoing rectal bleed for 2 weeks. As per patient she has been constipated off and on for 2 weeks but having bright red blood per rectum, occasionally with clots on a regular basis for past 2 weeks. She has been continuing anticoagulation. Patient also having generalized dull lower abdominal discomfort. Denies any headache, dizziness, syncope, fever, chills, nausea, vomiting, chest pain, palpitations, worsening shortness of breath, dysuria. Denies use of NSAIDs over-the-counter. Denies any new medications. No change in weight or appetite. Patient lives alone and ambulates with the help of a walker. Patient was hospitalized in January for cellulitis at [facility] and was admitted to rehabilitation at [facility] early this month by her PCP for generalized weakness.  Hospital Course by problem list:   1. Hematochezia: She presented with 1 month of progressively worsening hematochezia, dyschezia, and melena while on rivaroxaban for paroxysmal atrial fibrillation. On admission she was hemodynamically stable and her hemoglobin was normal. Given the concomitant melena, gastroenterology performed a colonoscopy that showed non-bleeding internal hemorrhoids and non-bleeding diverticulosis. Given this was a mild bleed with a normal hemoglobin that  has resolved spontaneously, we resumed her rivaroxaban, prescribed Anusol for her hemorrhoids, and discharged her home with home health physical therapy with close follow-up with her primary doctor.  2. Paroxysmal atrial fibrillation: We held her rivaroxaban and resumed this upon discharge.  3. Chronic venous insufficiency: Wound care was consulted who wrapped her legs in Unna boots. She was discharged with home health to assist with Unna boot changes.  Discharge Vitals:   BP 125/49 mmHg  Pulse 107  Temp(Src) 97.8 F (36.6 C) (Oral)  Resp 34  Wt 268 lb 1.3 oz (121.6 kg)  SpO2 100%  Discharge Labs:  Results for orders placed or performed during the hospital encounter of 06/10/15 (from the past 24 hour(s))  CBC     Status: Abnormal   Collection Time: 06/10/15  6:30 PM  Result Value Ref Range   WBC 11.2 (H) 4.0 - 10.5 K/uL   RBC 4.56 3.87 - 5.11 MIL/uL   Hemoglobin 13.5 12.0 - 15.0 g/dL   HCT 16.1 09.6 - 04.5 %   MCV 99.3  78.0 - 100.0 fL   MCH 29.6 26.0 - 34.0 pg   MCHC 29.8 (L) 30.0 - 36.0 g/dL   RDW 16.1 09.6 - 04.5 %   Platelets 128 (L) 150 - 400 K/uL  Protime-INR     Status: Abnormal   Collection Time: 06/10/15  6:30 PM  Result Value Ref Range   Prothrombin Time 17.7 (H) 11.6 - 15.2 seconds   INR 1.45 0.00 - 1.49  Type and screen Hold 2 units now and stay ahead 2 units at all times     Status: None (Preliminary result)   Collection Time: 06/10/15  6:30 PM  Result Value Ref Range   ABO/RH(D) A POS    Antibody Screen NEG    Sample Expiration 06/13/2015    Unit Number [id number]    Blood Component Type RED CELLS,LR    Unit division 00    Status of Unit ALLOCATED    Transfusion Status OK TO TRANSFUSE    Crossmatch Result Compatible    Unit Number [id number]    Blood Component Type RED CELLS,LR    Unit division 00    Status of Unit ALLOCATED    Transfusion Status OK TO TRANSFUSE    Crossmatch Result Compatible   Comprehensive metabolic panel     Status: Abnormal     Collection Time: 06/10/15  6:30 PM  Result Value Ref Range   Sodium 140 135 - 145 mmol/L   Potassium 4.0 3.5 - 5.1 mmol/L   Chloride 96 (L) 101 - 111 mmol/L   CO2 35 (H) 22 - 32 mmol/L   Glucose, Bld 114 (H) 65 - 99 mg/dL   BUN 10 6 - 20 mg/dL   Creatinine, Ser 9.62 0.44 - 1.00 mg/dL   Calcium 9.0 8.9 - 95.2 mg/dL   Total Protein 6.6 6.5 - 8.1 g/dL   Albumin 3.3 (L) 3.5 - 5.0 g/dL   AST 22 15 - 41 U/L   ALT 11 (L) Buckley - 54 U/L   Alkaline Phosphatase 73 38 - 126 U/L   Total Bilirubin 1.0 0.3 - 1.2 mg/dL   GFR calc non Af Amer >60 >60 mL/min   GFR calc Af Amer >60 >60 mL/min   Anion gap 9 5 - 15  ABO/Rh     Status: None   Collection Time: 06/10/15  6:30 PM  Result Value Ref Range   ABO/RH(D) A POS   Prepare RBC     Status: None   Collection Time: 06/10/15  6:57 PM  Result Value Ref Range   Order Confirmation ORDER PROCESSED BY BLOOD BANK   Basic metabolic panel     Status: Abnormal   Collection Time: 06/11/15  6:35 AM  Result Value Ref Range   Sodium 138 135 - 145 mmol/L   Potassium 3.5 3.5 - 5.1 mmol/L   Chloride 95 (L) 101 - 111 mmol/L   CO2 36 (H) 22 - 32 mmol/L   Glucose, Bld 104 (H) 65 - 99 mg/dL   BUN 6 6 - 20 mg/dL   Creatinine, Ser 8.41 0.44 - 1.00 mg/dL   Calcium 8.4 (L) 8.9 - 10.3 mg/dL   GFR calc non Af Amer >60 >60 mL/min   GFR calc Af Amer >60 >60 mL/min   Anion gap 7 5 - 15  CBC     Status: Abnormal   Collection Time: 06/11/15  6:35 AM  Result Value Ref Range   WBC 10.3 4.0 - 10.5 K/uL   RBC 4.45 3.87 -  5.11 MIL/uL   Hemoglobin 12.9 12.0 - 15.0 g/dL   HCT 16.144.2 09.636.0 - 04.546.0 %   MCV 99.3 78.0 - 100.0 fL   MCH 29.0 26.0 - 34.0 pg   MCHC 29.2 (L) 30.0 - 36.0 g/dL   RDW 40.914.1 81.111.5 - 91.415.5 %   Platelets 128 (L) 150 - 400 K/uL    Signed: Selina CooleyKyle Kearra Calkin, MD 06/12/2015, 1:31 PM    Services Ordered on Discharge: Home health PT

## 2015-06-11 NOTE — Progress Notes (Signed)
PT Cancellation Note  Patient Details Name: Misty Buckley MRN: [id number] DOB: 1940/10/02   Cancelled Treatment:    Reason Eval/Treat Not Completed: Fatigue/lethargy limiting ability to participate. Pt continues to have bedrest with bathroom privileges order. Spoke with pt this PM. She is very lethargic and unable to stay awake longer than 2-3 minutes at a time. PT to re-attempt eval tomorrow.   [name], Taralyn Ferraiolo Rene 06/11/2015, 3:44 PM

## 2015-06-11 NOTE — Consult Note (Signed)
HISTORY OF PRESENT ILLNESS:  Patient seen in [facility] office by Dr. Marina GoodellPerry on 3/31 and directly admitted to hospitalist service for GI bleeding in the setting of Xarelto anti-coagulation.  Dr. Lamar SprinklesPerry's note as follows:  Misty Buckley is a 75 y.o. 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 The patient is new to this office and sent this afternoon by her PCP Dr. Dorothe [facility]Koehler at [facility], with new onset rectal bleeding this week. She describes significant red blood and clots. She has had some generalized lower abdominal discomfort. Some nausea but no vomiting. She does report chronic GERD but is not on PPI. Multiple medications as listed. Saw her PCP today. Encounter reviewed. Labs reviewed. Rectal exam at that time revealed red blood clots. Her xeralto was stopped. Evaluation in this office this afternoon arranged. Outside laboratories from earlier today have been received and reviewed. Blood cell count 14.1, hemoglobin 13.3, platelets 141,000. The patient's thinks that she may have had prior endoscopic procedures, but if so this has been decades and there are no details.  She received a dose of Moviprep last night and still has not moved her bowels.  Going to receive the second half this  AM.  Last dose of Xarelto was 3/30.  She tells me that she's had bleeding before and doesn't know where it comes from.  Never had colonoscopy.  Hgb is stable/normal currently.   REVIEW OF SYSTEMS:  All non-GI ROS negative except for shortness of breath, back pain, hip pain, arthritis, lower extremity edema, neck pain,  Past Medical History  Diagnosis Date  . Morbid obesity (HCC)   . Hypertension   . A-fib (HCC)   . Chronic anticoagulation   . Osteoarthritis   . Cervical spondylosis   . DDD (degenerative disc disease), cervical   . Fibromyalgia   . Chronic venous insufficiency   . Umbilical hernia   . Benign essential tremor   . Insomnia     Past Surgical History  Procedure Laterality Date  . Partial hysterectomy    . Inguinal hernia repair Right     Social History Misty Buckley  reports that she has never smoked. She has never used smokeless tobacco. She reports that she does not drink alcohol or use illicit drugs.  family history includes Diabetes in her mother; Heart attack in her brother; Heart attack (age of onset: 4457) in her father.  Allergies  Allergen Reactions  . Risperidone And Related     PHYSICAL EXAMINATION: Vital signs: BP 114/60 mmHg  Pulse 104  Ht  Wt   Constitutional: Pleasant, markedly obese, chronically ill and unhealthy appearing, no acute distress but extremely limited mobility Psychiatric: alert and oriented x3, cooperative but intermittent problems with memory recall Eyes: extraocular movements intact, anicteric, conjunctiva pink Mouth: oral pharynx moist, no lesions Neck: supple but thick Lymph: no lymphadenopathy Cardiovascular: heart regular rate and rhythm, soft systolic with distant breath sounds murmur Lungs: clear to auscultation bilaterally Abdomen: soft, markedly  obese, nontender, nondistended, no obvious ascites, no peritoneal signs, normal bowel sounds, no  organomegaly. Umbilical hernia Rectal: No external abnormalities. No tenderness. No fissure. No internal mass. Cherry red blood on the examining finger Extremities: No clubbing or cyanosis. Bilateral lower extremities are wrapped with bandages with obvious woody edema Skin: no relevant lesions on poor so or upper extremities. Lower extremities as described Neuro: No focal deficits. Cranial nerves intact. No asterixis.  ASSESSMENT:  #1. Rectal bleeding on chronic anticoagulation therapy. Etiology unclear. Hemodynamically stable. Hemoglobin stable/normal. #2. History of atrial fibrillation on Xarelto. Just stopped on 3/31 (last dose 3/30). #3. MULTIPLE MULTIPLE significant medical problems. Impossibility to workup her rectal bleeding as an outpatient. She is high risk for procedural work under the best of circumstances. Needs hospitalized for further assessment and evaluation.   PLAN:  #1. Admitted directly from our GI office to the hospitalist service.   #2. Will plan for colonoscopy 4/2.  Will give another dose of moviprep tonight/tmorrow AM. #3.  Monitor Hgb.          ATTENDING ADDENDUM: I have taken an interval history, reviewed the chart, and examined the patient. I agree with the Advanced Practitioner's note and impression.  The patient completed a full bowel prep on Friday evening however she had no bowel movements until later today. She has passed a few loose stools today but is not prepped for colonoscopy. Her Hgb is stable, Xarelto off for 48 hours, she has not had blood in the past few stools I suspect she more than likely has hemorrhoidal bleeding causing these symptoms but will evaluate this while inpatient given her comorbidities. I have ordered another bowel preparation now and will see if this clears her out in preparation for possible colonoscopy tomorrow. If she is ready for colonoscopy I have asked for anesthesia assistance for her case. Otherwise, if her preparation is  not adequate despite 2 preps, will give her a fleet enema and perform flex sig to obtain some type of evaluation. She is in agreement following discussion of risks / benefits. We will reassess her in the morning.   Ileene Patrick, MD [facility] Pager [phone]

## 2015-06-11 NOTE — Progress Notes (Addendum)
Patient ID: ALICE BURNSIDE, female   DOB: 07-01-1940, 75 y.o.   MRN: [id number]   Subjective: Ms. Salido was admitted by [facility] for hematochezia with a stable hemoglobin of 13 on rivaroxaban. She is a [facility] and is thus being transferred to our service today.  She is a 75 year old lady with an extensive past medical history including morbid obesity, obstructive sleep apnea with severe pulmonary hypertension on 4L oxygen at home, and paroxysmal atrial fibrillation on rivaroxaban. For the last 3 months, she has had intermittent bright red blood per rectum and anal pain while having bowel movements. This worsened over the last 3 days and she has been passing more bright red blood amidst melanic stools as well. She denied any nausea or vomiting, no NSAID or alcohol use, change in weight or appetite. On 3/31, she saw the gastroenterologist, Dr. Scarlette Shorts, who recommended admitting her for possible flexible sigmoidoscopy to be observed as an inpatient given her numerous medical problems.  Objective: Vital signs in last 24 hours: Filed Vitals:   06/10/15 1759 06/11/15 0557  BP: 125/74 129/47  Pulse: 105 100  Temp: 99.1 F (37.3 C) 99.2 F (37.3 C)  TempSrc:  Oral  Resp: 16 18  Weight: 268 lb 1.3 oz (121.6 kg)   SpO2: 97% 94%   General: obese lady resting in bed comfortably, appropriately conversational HEENT: no scleral icterus, extra-ocular muscles intact, oropharynx without lesions Cardiac: regular rate and rhythm, no rubs, murmurs or gallops Pulm: breathing well,some crackles louder on right than left, otherwise clear Abd: bowel sounds hypoactive, reducible umbilical hernia, soft and non-tender to palpation throughout Ext: warm and well perfused, with bilateral 2+ pedal edema to shins with hyperpigmented plaques Lymph: no cervical or supraclavicular lymphadenopathy Skin: no rash, hair, or nail changes Neuro: alert and oriented X3, cranial nerves II-XII grossly intact, moving  all extremities well  Lab Results: CBC:  Recent Labs Lab 06/10/15 1830 06/11/15 0635  WBC 11.2* 10.3  HGB 13.5 12.9  HCT 45.3 44.2  MCV 99.3 99.3  PLT 128* 128*   Coagulation:  Recent Labs Lab 06/10/15 1830  LABPROT 17.7*  INR 1.45   Medications: I have reviewed the patient's current medications. Scheduled Meds: . antiseptic oral rinse  7 mL Mouth Rinse BID  . calcium-vitamin D  1 tablet Oral Q breakfast  . DULoxetine  30 mg Oral Daily  . estradiol  1 Applicatorful Vaginal QHS  . furosemide  80 mg Oral BID  . metolazone  2.5 mg Oral Daily  . metoprolol succinate  50 mg Oral Daily  . multivitamin with minerals  1 tablet Oral Daily  . pantoprazole (PROTONIX) IV  40 mg Intravenous Q12H  . peg 3350 powder  0.5 kit Oral Once  . polyethylene glycol  17 g Oral Daily  . potassium chloride  20 mEq Oral BID  . simvastatin  20 mg Oral QHS  . sodium chloride flush  3 mL Intravenous Q12H   Continuous Infusions:  PRN Meds:.ALPRAZolam, HYDROcodone-acetaminophen, senna   Assessment/Plan:  Miss Murley is a 75 year old lady with morbid obesity, pulmonary hypertension, and paroxysmal atrial fibrillation on rivaroxaban admitted for painful hematochezia with a stable hemoglobin.  Gastroenterology is following and is considering performing a flexible sigmoidoscopy today.  Hematochezia: Given the concomitant pain when having bowel movements, I suspect this is likely anal fissures with worsened bleeding from chronic anticoagulation with rivaroxaban; however she also reports melena so there's concern for a bleed further up the tract. Her vital  signs and hemoglobin remain stable. Gastroenterology is following and is considering performing a flexible sigmoidoscopy today. We'll continue with IV pantoprazole for now. -Thanks for your help gastroenterology -Continue IV pantoprazole 13m twice daily -Holding rivaroxaban -Daily CBCs  Heart failure with preserved ejection fraction: Chart review  reports pulmonary hypertension and diastolic heart failure. She appears euvolemic so we'll continue her home diuretics. -Continue metolazone 2.598mdaily -Continue furosemide 8067mwice daily  Paroxysmal atrial fibrillation: Her rates were in the low 100s overnight. -Continue metoprolol succinate 27m72mily -We'll need to re-consider anticoagulation going forward  Chronic venous insufficiency: She has  -Wound care consulted -Consulted physical therapy  Anxiety and depression: Her mood is stable. -Continue duloxetine 30mg33mly  Dispo: Disposition is deferred at this time, awaiting improvement of current medical problems.  Anticipated discharge in approximately 2-5 day(s).   The patient does have a current PCP (RobeJanifer Adie and [facility].  The patient does have transportation limitations that hinder transportation to clinic appointments.  .Services Needed at time of discharge: Y = Yes, Blank = No PT:   OT:   RN:   Equipment:   Other:     LOS: 1 day   Shlok Raz Loleta Chance4/03/2015, 7:40 AM

## 2015-06-11 NOTE — Progress Notes (Signed)
PT Cancellation Note  Patient Details Name: Misty Buckley MRN: [id number] DOB: June 15, 1940   Cancelled Treatment:    Reason Eval/Treat Not Completed: Patient not medically ready. Pt has orders for bedrest. Please update activity order, when appropriate, for PT to proceed with eval.   [name], [name] 06/11/2015, 8:38 AM

## 2015-06-11 NOTE — Consult Note (Signed)
WOC wound consult note Reason for Consult:right anterior LE wound in the presence of chronic venous insufficiency Wound type: VLU, Clinical CEAP 6 Pressure Ulcer POA: No Measurement:1.5cm x 1cm x 0.2cm Wound bed: red, moist Drainage (amount, consistency, odor) moderate amount of serous exudate Periwound: hemosiderin staining bilaterally Dressing procedure/placement/frequency:Patient is followed by [facility] and the current management strategy is a silver dressing and Unna's boots changed either two or three times a week.  I will implement that POC here and have today provided Nursing with orders for topical wound care which will be followed by Ortho Tech applying Unna's boots bilaterally. Following today's application, I have requested twice weekly changes on Tuesday and Fridays. WOC nursing team will not follow, but will remain available to this patient, the nursing and medical teams.  Please re-consult if needed. Thanks, [name], MSN, RN, GNP, Hans EdenCWOCN, CWON-AP, FAAN  Pager# [phone]

## 2015-06-11 NOTE — Progress Notes (Signed)
Orthopedic Tech Progress Note Patient Details:  Misty Buckley [birth year] [id number]  Ortho Devices Type of Ortho Device: Roland RackUnna boot Ortho Device/Splint Location: bilateral Ortho Device/Splint Interventions: Application   [name], [name] 06/11/2015, 2:35 PM

## 2015-06-11 NOTE — Progress Notes (Signed)
Erythematous area has been outlined and a pink foam has been applied to the open area as per MD orders. Will continue to monitor.

## 2015-06-12 ENCOUNTER — Inpatient Hospital Stay (HOSPITAL_COMMUNITY): Payer: Medicare (Managed Care) | Admitting: Anesthesiology

## 2015-06-12 ENCOUNTER — Encounter (HOSPITAL_COMMUNITY)
Admission: AD | Disposition: A | Discharge status: Skilled Nursing Facility | Payer: Self-pay | Source: Ambulatory Visit | Attending: Oncology

## 2015-06-12 ENCOUNTER — Encounter (HOSPITAL_COMMUNITY): Payer: Self-pay | Admitting: Anesthesiology

## 2015-06-12 DIAGNOSIS — I872 Venous insufficiency (chronic) (peripheral): Secondary | ICD-10-CM

## 2015-06-12 DIAGNOSIS — K5791 Diverticulosis of intestine, part unspecified, without perforation or abscess with bleeding: Secondary | ICD-10-CM

## 2015-06-12 DIAGNOSIS — Z7901 Long term (current) use of anticoagulants: Secondary | ICD-10-CM

## 2015-06-12 DIAGNOSIS — K649 Unspecified hemorrhoids: Secondary | ICD-10-CM | POA: Diagnosis present

## 2015-06-12 DIAGNOSIS — I480 Paroxysmal atrial fibrillation: Secondary | ICD-10-CM

## 2015-06-12 DIAGNOSIS — K648 Other hemorrhoids: Principal | ICD-10-CM

## 2015-06-12 DIAGNOSIS — K921 Melena: Secondary | ICD-10-CM

## 2015-06-12 HISTORY — PX: COLONOSCOPY: SHX5424

## 2015-06-12 LAB — SURGICAL PCR SCREEN
MRSA, PCR: NEGATIVE
STAPHYLOCOCCUS AUREUS: NEGATIVE

## 2015-06-12 SURGERY — COLONOSCOPY
Anesthesia: Moderate Sedation

## 2015-06-12 SURGERY — COLONOSCOPY
Anesthesia: Monitor Anesthesia Care

## 2015-06-12 MED ORDER — LACTATED RINGERS IV SOLN
INTRAVENOUS | Status: DC | PRN
  Administered 2015-06-12: 09:00:00 via INTRAVENOUS

## 2015-06-12 MED ORDER — HYDROCORTISONE ACETATE 25 MG RE SUPP: 25.0000 mg | Freq: Two times a day (BID) | RECTAL | Status: DC

## 2015-06-12 MED ORDER — PROPOFOL 500 MG/50ML IV EMUL
INTRAVENOUS | Status: DC | PRN
  Administered 2015-06-12: 50 ug/kg/min via INTRAVENOUS

## 2015-06-12 MED ORDER — LACTATED RINGERS IV SOLN
INTRAVENOUS | Status: DC
  Administered 2015-06-12: 1000 mL via INTRAVENOUS

## 2015-06-12 MED ORDER — PHENYLEPHRINE HCL 10 MG/ML IJ SOLN
INTRAMUSCULAR | Status: DC | PRN
  Administered 2015-06-12 (×2): 120 ug via INTRAVENOUS
  Administered 2015-06-12: 80 ug via INTRAVENOUS
  Administered 2015-06-12 (×2): 120 ug via INTRAVENOUS
  Administered 2015-06-12: 80 ug via INTRAVENOUS

## 2015-06-12 MED ORDER — PROPOFOL 10 MG/ML IV BOLUS
INTRAVENOUS | Status: DC | PRN
  Administered 2015-06-12 (×4): 10 mg via INTRAVENOUS

## 2015-06-12 MED ORDER — SODIUM CHLORIDE 0.9 % IV SOLN: INTRAVENOUS | Status: DC

## 2015-06-12 MED ORDER — PHENYLEPHRINE HCL 10 MG/ML IJ SOLN
10.0000 mg | INTRAMUSCULAR | Status: DC | PRN
  Administered 2015-06-12: 50 ug/min via INTRAVENOUS

## 2015-06-12 NOTE — Progress Notes (Signed)
Physical Therapy Cancellation  Patient has been taken off unit for colonoscopy. Will follow-up when she returns, as soon as schedule allows.    06/12/15 0900  PT Visit Information  Last PT Received On 06/12/15  Reason Eval/Treat Not Completed Patient at procedure or test/unavailable    Charlsie MerlesLogan Secor Charli Liberatore, PT [phone]

## 2015-06-12 NOTE — Anesthesia Postprocedure Evaluation (Signed)
Anesthesia Post Note  Patient: Misty Buckley  Procedure(s) Performed: Procedure(s) (LRB): COLONOSCOPY (N/A)  Patient location during evaluation: Endoscopy Anesthesia Type: MAC Level of consciousness: awake Vital Signs Assessment: post-procedure vital signs reviewed and stable Respiratory status: spontaneous breathing Cardiovascular status: stable Anesthetic complications: no    Last Vitals:  Filed Vitals:   06/12/15 1045 06/12/15 1055  BP: 148/63 125/49  Pulse: 105 107  Temp:    Resp: 20 34    Last Pain:  Filed Vitals:   06/12/15 1310  PainSc: 7                  EDWARDS,Lory Galan

## 2015-06-12 NOTE — Op Note (Signed)
[facility] Patient Name: Misty Buckley Procedure Date : 06/12/2015 MRN: [id number] Attending MD: Willaim Rayas. Adela Lank , MD Date of Birth: 1940-11-28 CSN: [id number] Age: 75 Admit Type: Inpatient Procedure:                Colonoscopy Indications:              Hematochezia, patient on Xarelto - last dose 3 days                            prior. Patient completed 2 bowel preparations for                            this exam. Providers:                Willaim Rayas. Adela Lank, MD, Maralyn Sago Monday, RN, Oletha Blend, Technician Referring MD:              Medicines:                Monitored Anesthesia Care Complications:            No immediate complications. Estimated blood loss:                            None. Estimated Blood Loss:     Estimated blood loss: none. Procedure:                Pre-Anesthesia Assessment:                           - Prior to the procedure, a History and Physical                            was performed, and patient medications and                            allergies were reviewed. The patient's tolerance of                            previous anesthesia was also reviewed. The risks                            and benefits of the procedure and the sedation                            options and risks were discussed with the patient.                            All questions were answered, and informed consent                            was obtained. Prior Anticoagulants: The patient has                            taken Xarelto (  rivaroxaban), last dose was 3 days                            prior to procedure. ASA Grade Assessment: III - A                            patient with severe systemic disease. After                            reviewing the risks and benefits, the patient was                            deemed in satisfactory condition to undergo the                            procedure.                           After  obtaining informed consent, the colonoscope                            was passed under direct vision. Throughout the                            procedure, the patient's blood pressure, pulse, and                            oxygen saturations were monitored continuously. The                            EC-[id number] ([id number]) scope was introduced through                            the anus and advanced to the the cecum, identified                            by appendiceal orifice and ileocecal valve. The                            colonoscopy was technically difficult and complex                            due to significant looping. The patient tolerated                            the procedure well. The quality of the bowel                            preparation was fair. The ileocecal valve,                            appendiceal orifice, and rectum were photographed. Scope In: 9:06:49 AM Scope Out: 9:38:21 AM Scope Withdrawal Time: 0 hours 10 minutes 11 seconds  Total Procedure Duration: 0 hours  31 minutes 32 seconds  Findings:      The perianal and digital rectal examinations were normal.      Scattered medium-mouthed diverticula were found in the entire colon.      Non-bleeding internal hemorrhoids were found. The hemorrhoids were large       but no active bleeding was noted.      The exam was otherwise normal throughout the examined colon. No polyps       or mass lesions were noted however the bowel preparation was only fair       and significant lavage was needed for adequate views. No polyps were       appreciated, however small or flat polyps may not have been appreciated       in dependant portions of the colon. There was liquid stool throughout       the colon but no blood appreciated. Impression:               - Preparation of the colon was fair but following                            lavage adequate views were obtained. No large mass                            lesions or polyps  however very small or flat polyps                            may not have been appreciated given this prep.                           - Diverticulosis in the entire examined colon.                           - Non-bleeding internal hemorrhoids which are large                            and I suspect are the most likely cause of the                            patient's symptoms. Moderate Sedation:      none Recommendation:           - Return patient to hospital ward for ongoing care.                           - Resume previous diet.                           - Continue present medications                           - Okay to resume Xarelto                           - Daily fiber supplement                           - Anusol suppository daily                           -  If hemorrhoids continue to bleed moving forward                            would recommend a general surgery consultation as                            given her Xarelto use, banding is relative                            contraindication                           - Okay to discharge home from GI perspective, no                            active bleeding, her Hgb remained normal throughout                            the hospitalization                           - No repeat colonoscopy due to age. Procedure Code(s):        --- Professional ---                           [phone], Colonoscopy, flexible; diagnostic, including                            collection of specimen(s) by brushing or washing,                            when performed (separate procedure) Diagnosis Code(s):        --- Professional ---                           K64.8, Other hemorrhoids                           K92.1, Melena (includes Hematochezia)                           K57.30, Diverticulosis of large intestine without                            perforation or abscess without bleeding CPT copyright 2016 [employer]. All rights  reserved. The codes documented in this report are preliminary and upon coder review may  be revised to meet current compliance requirements. Viviann Spare P. Armbruster, MD 06/12/2015 10:02:10 AM This report has been signed electronically. Number of Addenda: 0

## 2015-06-12 NOTE — Interval H&P Note (Signed)
History and Physical Interval Note:  06/12/2015 8:27 AM  Misty Buckley  has presented today for surgery, with the diagnosis of Rectal bleeding  The various methods of treatment have been discussed with the patient and family. After consideration of risks, benefits and other options for treatment, the patient has consented to  Procedure(s): COLONOSCOPY (N/A) as a surgical intervention .  The patient's history has been reviewed, patient examined, no change in status, stable for surgery.  I have reviewed the patient's chart and labs.  Questions were answered to the patient's satisfaction.     Reeves ForthSteven Paul Armbruster

## 2015-06-12 NOTE — Evaluation (Signed)
Physical Therapy Evaluation Patient Details Name: Misty Buckley MRN: [id number] DOB: June 30, 1940 Today's Date: 06/12/2015   History of Present Illness  She is a 75 year old lady with an extensive past medical history including morbid obesity, obstructive sleep apnea with severe pulmonary hypertension on 4L oxygen at home, and paroxysmal atrial fibrillation on rivaroxaban.   Clinical Impression  Pt admitted with above complications. Pt currently with functional limitations due to the deficits listed below (see PT Problem List). Able to perform transfers and ambulate up to 50 feet without physical assist although these tasks were obviously difficult for patient to complete. SpO2 mid 90s throughout therapy session on 4L supplemental O2. Would maximize [facility] services as able. Will follow and progress until d/c.      Follow Up Recommendations Home health PT;Supervision for mobility/OOB    Equipment Recommendations  None recommended by PT    Recommendations for Other Services       Precautions / Restrictions Precautions Precautions: Fall Restrictions Weight Bearing Restrictions: No      Mobility  Bed Mobility Overal bed mobility: Modified Independent             General bed mobility comments: extra time HOB elevated  Transfers Overall transfer level: Needs assistance Equipment used: Rolling walker (2 wheeled) Transfers: Sit to/from [employer] Sit to Stand: Supervision Stand pivot transfers: Supervision       General transfer comment: Supervision for safety. Impulsive to stand. VC for hand placement and to use RW appropriately.  Ambulation/Gait Ambulation/Gait assistance: Supervision Ambulation Distance (Feet): 50 Feet Assistive device: Rolling walker (2 wheeled) Gait Pattern/deviations: Step-through pattern;Decreased stance time - left;Wide base of support;Trunk flexed Gait velocity: decreased Gait velocity interpretation: Below normal speed for  age/gender General Gait Details: VC for walker placement for proximity. VC for upright posture and forward gaze. SpO2 95% on 4L supplemental O2. Decliens to ambulate further distance.  Stairs            Wheelchair Mobility    Modified Rankin (Stroke Patients Only)       Balance Overall balance assessment: Needs assistance Sitting-balance support: No upper extremity supported;Feet supported Sitting balance-Leahy Scale: Good     Standing balance support: Bilateral upper extremity supported Standing balance-Leahy Scale: Poor                               Pertinent Vitals/Pain Pain Assessment: Faces Faces Pain Scale: Hurts even more Pain Location: back Pain Descriptors / Indicators: Aching Pain Intervention(s): Monitored during session;Repositioned    Home Living Family/patient expects to be discharged to:: Private residence Living Arrangements: Alone Available Help at Discharge: Family;Friend(s);Available PRN/intermittently   Home Access: Stairs to enter Entrance Stairs-Rails: None Entrance Stairs-Number of Steps: 1 ("Small threshold") Home Layout: One level Home Equipment: Walker - 4 wheels;Bedside commode;Hospital bed      Prior Function Level of Independence: Independent with assistive device(s)         Comments: rollator for mobility     Hand Dominance        Extremity/Trunk Assessment   Upper Extremity Assessment: Defer to OT evaluation           Lower Extremity Assessment: Generalized weakness (BIL LEs bandaged)         Communication      Cognition Arousal/Alertness: Awake/alert Behavior During Therapy: Impulsive Overall Cognitive Status: No family/caregiver present to determine baseline cognitive functioning       Memory: Decreased short-term memory  General Comments General comments (skin integrity, edema, etc.): Urgently needed to use BSC after sitting up, was incontinent [facility]aned, RN notified that bandages on LEs will need to be changed.    Exercises        Assessment/Plan    PT Assessment Patient needs continued PT services  PT Diagnosis Difficulty walking;Abnormality of gait;Generalized weakness;Acute pain   PT Problem List Decreased strength;Decreased range of motion;Decreased activity tolerance;Decreased balance;Decreased mobility;Decreased knowledge of use of DME;Cardiopulmonary status limiting activity;Obesity;Pain  PT Treatment Interventions DME instruction;Gait training;Functional mobility training;Therapeutic activities;Therapeutic exercise;Balance training;Neuromuscular re-education;Patient/family education;Cognitive remediation;Stair training   PT Goals (Current goals can be found in the Care Plan section) Acute Rehab PT Goals Patient Stated Goal: Go home as soon as possible PT Goal Formulation: With patient Time For Goal Achievement: 06/26/15 Potential to Achieve Goals: Fair    Frequency Min 3X/week   Barriers to discharge Decreased caregiver support Lives alone, states sister can help daill and a neighbor/friend is always available.    Co-evaluation               End of Session Equipment Utilized During Treatment: Gait belt;Oxygen Activity Tolerance: Patient limited by pain Patient left: in chair;with call bell/phone within reach;with chair alarm set Nurse Communication: Mobility status (Bandages need changed on LEs)         Time: 1129-1206 PT Time Calculation (min) (ACUTE ONLY): 37 min   Charges:   PT Evaluation $PT Eval Moderate Complexity: 1 Procedure PT Treatments $Therapeutic Activity: 8-22 mins   PT G CodesBerton Mount:        Aeron Donaghey S 06/12/2015, 12:24 PM  Charlsie MerlesLogan Secor Dariya Gainer, [place]PT [phone]

## 2015-06-12 NOTE — Discharge Instructions (Signed)
[name],  You were hospitalized for a gastrointestinal bleed. Your blood counts were normal so this was not a significant bleed. Gastroenterology performed a colonoscopy that showed two things: diverticulosis and internal hemorrhoids. Diverticulosis is an outpouching of the lining of the large intestine that can sometimes erode into arteries and cause brisk bleeds. I don't think this is what you had because your blood loss was so insignificant. Instead, I think your blood loss was from internal hemorrhoids, which are veins that protrude into GI tract that tend to ooze and not cause significant bleeding. We've prescribed you Anusol to help with the hemorrhoids. I think it's safe to resume your Xarelto for the time being. If you have more GI bleeds, I think you should see your primary doctor to discuss switching to a different blood thinner.  Take care, Dr. Earnest ConroyFlores

## 2015-06-12 NOTE — Anesthesia Procedure Notes (Signed)
Procedure Name: MAC Date/Time: 06/12/2015 9:00 AM Performed by: Lovie CholOCK, Daiton Cowles K Pre-anesthesia Checklist: Patient identified, Emergency Drugs available, Suction available, Patient being monitored and Timeout performed Patient Re-evaluated:Patient Re-evaluated prior to inductionOxygen Delivery Method: Simple face mask

## 2015-06-12 NOTE — Progress Notes (Signed)
Unna boots changed by ortho tech. Will continue to monitor patient.

## 2015-06-12 NOTE — Progress Notes (Signed)
Orthopedic Tech Progress Note Patient Details:  Misty Buckley 1941-03-01 [id number]  Ortho Devices Type of Ortho Device: Roland RackUnna boot Ortho Device/Splint Location: bilateral Ortho Device/Splint Interventions: Application   Misty Buckley, Misty Buckley 06/12/2015, 6:55 AM

## 2015-06-12 NOTE — H&P (View-Only) (Signed)
  HISTORY OF PRESENT ILLNESS:  Patient seen in [facility] office by Dr. Perry on 3/31 and directly admitted to hospitalist service for GI bleeding in the setting of Xarelto anti-coagulation.  Dr. Perry's note as follows:  Misty Buckley is a 75 y.o. 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 The patient is new to this office and sent this afternoon by her PCP Dr. Koehler at [facility], with new onset rectal bleeding this week. She describes significant red blood and clots. She has had some generalized lower abdominal discomfort. Some nausea but no vomiting. She does report chronic GERD but is not on PPI. Multiple medications as listed. Saw her PCP today. Encounter reviewed. Labs reviewed. Rectal exam at that time revealed red blood clots. Her xeralto was stopped. Evaluation in this office this afternoon arranged. Outside laboratories from earlier today have been received and reviewed. Blood cell count 14.1, hemoglobin 13.3, platelets 141,000. The patient's thinks that she may have had prior endoscopic procedures, but if so this has been decades and there are no details.  She received a dose of Moviprep last night and still has not moved her bowels.  Going to receive the second half this  AM.  Last dose of Xarelto was 3/30.  She tells me that she's had bleeding before and doesn't know where it comes from.  Never had colonoscopy.  Hgb is stable/normal currently.   REVIEW OF SYSTEMS:  All non-GI ROS negative except for shortness of breath, back pain, hip pain, arthritis, lower extremity edema, neck pain,  Past Medical History  Diagnosis Date  . Morbid obesity (HCC)   . Hypertension   . A-fib (HCC)   . Chronic anticoagulation   . Osteoarthritis   . Cervical spondylosis   . DDD (degenerative disc disease), cervical   . Fibromyalgia   . Chronic venous insufficiency   . Umbilical hernia   . Benign essential tremor   . Insomnia     Past Surgical History  Procedure Laterality Date  . Partial hysterectomy    . Inguinal hernia repair Right     Social History Maxie M Pfefferkorn  reports that she has never smoked. She has never used smokeless tobacco. She reports that she does not drink alcohol or use illicit drugs.  family history includes Diabetes in her mother; Heart attack in her brother; Heart attack (age of onset: 57) in her father.  Allergies  Allergen Reactions  . Risperidone And Related     PHYSICAL EXAMINATION: Vital signs: BP 114/60 mmHg  Pulse 104  Ht  Wt   Constitutional: Pleasant, markedly obese, chronically ill and unhealthy appearing, no acute distress but extremely limited mobility Psychiatric: alert and oriented x3, cooperative but intermittent problems with memory recall Eyes: extraocular movements intact, anicteric, conjunctiva pink Mouth: oral pharynx moist, no lesions Neck: supple but thick Lymph: no lymphadenopathy Cardiovascular: heart regular rate and rhythm, soft systolic with distant breath sounds murmur Lungs: clear to auscultation bilaterally Abdomen: soft, markedly   obese, nontender, nondistended, no obvious ascites, no peritoneal signs, normal bowel sounds, no  organomegaly. Umbilical hernia Rectal: No external abnormalities. No tenderness. No fissure. No internal mass. Cherry red blood on the examining finger Extremities: No clubbing or cyanosis. Bilateral lower extremities are wrapped with bandages with obvious woody edema Skin: no relevant lesions on poor so or upper extremities. Lower extremities as described Neuro: No focal deficits. Cranial nerves intact. No asterixis.  ASSESSMENT:  #1. Rectal bleeding on chronic anticoagulation therapy. Etiology unclear. Hemodynamically stable. Hemoglobin stable/normal. #2. History of atrial fibrillation on Xarelto. Just stopped on 3/31 (last dose 3/30). #3. MULTIPLE MULTIPLE significant medical problems. Impossibility to workup her rectal bleeding as an outpatient. She is high risk for procedural work under the best of circumstances. Needs hospitalized for further assessment and evaluation.   PLAN:  #1. Admitted directly from our GI office to the hospitalist service.   #2. Will plan for colonoscopy 4/2.  Will give another dose of moviprep tonight/tmorrow AM. #3.  Monitor Hgb.          ATTENDING ADDENDUM: I have taken an interval history, reviewed the chart, and examined the patient. I agree with the Advanced Practitioner's note and impression.  The patient completed a full bowel prep on Friday evening however she had no bowel movements until later today. She has passed a few loose stools today but is not prepped for colonoscopy. Her Hgb is stable, Xarelto off for 48 hours, she has not had blood in the past few stools I suspect she more than likely has hemorrhoidal bleeding causing these symptoms but will evaluate this while inpatient given her comorbidities. I have ordered another bowel preparation now and will see if this clears her out in preparation for possible colonoscopy tomorrow. If she is ready for colonoscopy I have asked for anesthesia assistance for her case. Otherwise, if her preparation is  not adequate despite 2 preps, will give her a fleet enema and perform flex sig to obtain some type of evaluation. She is in agreement following discussion of risks / benefits. We will reassess her in the morning.   Marella Vanderpol, MD [facility] Pager [phone]  

## 2015-06-12 NOTE — Progress Notes (Signed)
Reviewed discharge paperwork with pt.  Reviewed prescription.  PIVs removed.  Pt denied any needs at this time.  Pt taken home via EMS.

## 2015-06-12 NOTE — Progress Notes (Signed)
CM met with pt and called pt's sister, Jackelyn Poling while in room.  Debbie requests [facility] transport home and she will accept pt.  Pt is on 4LNC.  CM called [facility] who confirmed they do not provide transportation on weekends.  [facility] states they have access to [facility] and agree with pt and sister, HHPT unnecessary as pt is going to be picked up by [facility] tomorrow and [facility] will have a POC including PT; sister and pt refuse HHPT.  CM called [facility] for transport home. NO other CM needs were communicated.

## 2015-06-12 NOTE — Anesthesia Preprocedure Evaluation (Addendum)
Anesthesia Evaluation  Patient identified by MRN, date of birth, ID band Patient awake    Reviewed: Allergy & Precautions, NPO status , Patient's Chart, lab work & pertinent test results  Airway Mallampati: II  TM Distance: >3 FB Neck ROM: Full    Dental   Pulmonary    breath sounds clear to auscultation       Cardiovascular hypertension, + Peripheral Vascular Disease   Rhythm:Regular Rate:Normal     Neuro/Psych    GI/Hepatic Neg liver ROS, History noted. CE   Endo/Other  negative endocrine ROS  Renal/GU negative Renal ROS     Musculoskeletal   Abdominal   Peds  Hematology   Anesthesia Other Findings   Reproductive/Obstetrics                            Anesthesia Physical Anesthesia Plan  ASA: IV  Anesthesia Plan: MAC   Post-op Pain Management:    Induction: Intravenous  Airway Management Planned: Simple Face Mask  Additional Equipment:   Intra-op Plan:   Post-operative Plan:   Informed Consent: I have reviewed the patients History and Physical, chart, labs and discussed the procedure including the risks, benefits and alternatives for the proposed anesthesia with the patient or authorized representative who has indicated his/her understanding and acceptance.   Dental advisory given  Plan Discussed with: Anesthesiologist  Anesthesia Plan Comments:         Anesthesia Quick Evaluation

## 2015-06-12 NOTE — Progress Notes (Signed)
Patient ID: Misty Buckley, female   DOB: [birth year], 75 y.o.   MRN: [id number]   Subjective: [name] is feeling a little groggy after her colonoscopy but she doesn't have any complaints. She hasn't had any more bloody bowel movements.  Objective: Vital signs in last 24 hours: Filed Vitals:   06/12/15 1025 06/12/15 1035 06/12/15 1045 06/12/15 1055  BP: 112/67 126/64 148/63 125/49  Pulse: 102 103 105 107  Temp:      TempSrc:      Resp: 30 30 20  34  Weight:      SpO2: 98% 100% 100% 100%   General: resting in bed comfortably, appropriately conversational Cardiac: regular rate and rhythm, no rubs, murmurs or gallops Pulm: breathing well, bibasilar crackles Abd: bowel sounds normal, soft, umbilical hernia, nondistended, non-tender Ext: warm and well perfused, with 2+ pedal edema with brawny indurated plaques bilaterally to shins  Medications: I have reviewed the patient's current medications. Scheduled Meds: . antiseptic oral rinse  7 mL Mouth Rinse BID  . calcium-vitamin D  1 tablet Oral Q breakfast  . DULoxetine  30 mg Oral Daily  . estradiol  1 Applicatorful Vaginal QHS  . furosemide  80 mg Oral BID  . metolazone  2.5 mg Oral Daily  . metoprolol succinate  50 mg Oral Daily  . multivitamin with minerals  1 tablet Oral Daily  . pantoprazole (PROTONIX) IV  40 mg Intravenous Q12H  . polyethylene glycol  17 g Oral Daily  . polymixin-bacitracin   Topical BID  . potassium chloride SA  20 mEq Oral BID  . simvastatin  20 mg Oral QHS  . sodium chloride flush  3 mL Intravenous Q12H   Continuous Infusions:  PRN Meds:.ALPRAZolam, HYDROcodone-acetaminophen, senna   Assessment/Plan:  Miss Misty Buckley is a 75 year old lady with morbid obesity, pulmonary hypertension, and paroxysmal atrial fibrillation on rivaroxaban admitted for hematochezia; colonoscopy showed non-bleeding internal hemorrhoids and non-bleeding diverticulosis. We will resume her rivaroxaban and she is medically clear for  discharge today.  Hematochezia from internal hemorrhoids: Per above. -Thanks for your help gastroenterology -Anusol prescribed for hemorrhoids -Resumed rivaroxaban -Home health PT ordered -Discharge home with PCP follow-up next week  Heart failure with preserved ejection fraction: Chart review reports pulmonary hypertension and diastolic heart failure. She appears euvolemic so we'll continue her home diuretics. -Continue metolazone 2.5mg  daily -Continue furosemide 80mg  twice daily  Paroxysmal atrial fibrillation: Her rates were in the low 100s overnight. -Continue metoprolol succinate 50mg  daily -We'll need to re-consider anticoagulation going forward  Chronic venous insufficiency: She has Unna boots on. -Home health PT  Anxiety and depression: Her mood is stable. -Continue duloxetine 30mg  daily  Dispo: Discharge home with home health today.  The patient does have a current PCP Jethro Bastos(Robert N Koehler, MD) and [facility] after discharge.  The patient does have transportation limitations that hinder transportation to clinic appointments.  .Services Needed at time of discharge: Y = Yes, Blank = No PT:   OT:   RN:   Equipment:   Other:     LOS: 2 days   Selina CooleyKyle Melinna Linarez, MD 06/12/2015, 1:23 PM

## 2015-06-12 NOTE — Transfer of Care (Signed)
Immediate Anesthesia Transfer of Care Note  Patient: Misty Buckley  Procedure(s) Performed: Procedure(s): COLONOSCOPY (N/A)  Patient Location: Endoscopy Unit  Anesthesia Type:MAC  Level of Consciousness: awake, alert , oriented and patient cooperative  Airway & Oxygen Therapy: Patient Spontanous Breathing and Patient connected to face mask oxygen  Post-op Assessment: Report given to RN and Post -op Vital signs reviewed and stable  Post vital signs: Reviewed  Last Vitals:  Filed Vitals:   06/12/15 0808 06/12/15 0955  BP:  102/62  Pulse:  99  Temp: 36.6 C   Resp: 23 24    Complications: No apparent anesthesia complications

## 2015-06-13 ENCOUNTER — Encounter (HOSPITAL_COMMUNITY): Payer: Self-pay | Admitting: Gastroenterology

## 2015-06-14 ENCOUNTER — Emergency Department (HOSPITAL_COMMUNITY): Payer: Medicare (Managed Care)

## 2015-06-14 ENCOUNTER — Inpatient Hospital Stay (HOSPITAL_COMMUNITY)
Admission: EM | Admit: 2015-06-14 | Discharge: 2015-06-21 | DRG: 602 | Disposition: A | Discharge status: Skilled Nursing Facility | Payer: Medicare (Managed Care) | Attending: Oncology | Admitting: Oncology

## 2015-06-14 ENCOUNTER — Encounter (HOSPITAL_COMMUNITY): Payer: Self-pay | Admitting: *Deleted

## 2015-06-14 DIAGNOSIS — F329 Major depressive disorder, single episode, unspecified: Secondary | ICD-10-CM | POA: Diagnosis present

## 2015-06-14 DIAGNOSIS — E872 Acidosis: Secondary | ICD-10-CM | POA: Diagnosis present

## 2015-06-14 DIAGNOSIS — Z888 Allergy status to other drugs, medicaments and biological substances status: Secondary | ICD-10-CM

## 2015-06-14 DIAGNOSIS — M797 Fibromyalgia: Secondary | ICD-10-CM | POA: Diagnosis present

## 2015-06-14 DIAGNOSIS — J9611 Chronic respiratory failure with hypoxia: Secondary | ICD-10-CM | POA: Diagnosis not present

## 2015-06-14 DIAGNOSIS — R400 Somnolence: Secondary | ICD-10-CM | POA: Diagnosis not present

## 2015-06-14 DIAGNOSIS — E876 Hypokalemia: Secondary | ICD-10-CM | POA: Diagnosis not present

## 2015-06-14 DIAGNOSIS — Z90710 Acquired absence of both cervix and uterus: Secondary | ICD-10-CM | POA: Diagnosis not present

## 2015-06-14 DIAGNOSIS — Z7983 Long term (current) use of bisphosphonates: Secondary | ICD-10-CM

## 2015-06-14 DIAGNOSIS — R410 Disorientation, unspecified: Secondary | ICD-10-CM | POA: Diagnosis not present

## 2015-06-14 DIAGNOSIS — J9602 Acute respiratory failure with hypercapnia: Secondary | ICD-10-CM

## 2015-06-14 DIAGNOSIS — L03116 Cellulitis of left lower limb: Secondary | ICD-10-CM | POA: Diagnosis present

## 2015-06-14 DIAGNOSIS — J986 Disorders of diaphragm: Secondary | ICD-10-CM | POA: Diagnosis present

## 2015-06-14 DIAGNOSIS — J9621 Acute and chronic respiratory failure with hypoxia: Secondary | ICD-10-CM | POA: Diagnosis present

## 2015-06-14 DIAGNOSIS — I872 Venous insufficiency (chronic) (peripheral): Secondary | ICD-10-CM | POA: Diagnosis present

## 2015-06-14 DIAGNOSIS — R0602 Shortness of breath: Secondary | ICD-10-CM

## 2015-06-14 DIAGNOSIS — Z9981 Dependence on supplemental oxygen: Secondary | ICD-10-CM

## 2015-06-14 DIAGNOSIS — E662 Morbid (severe) obesity with alveolar hypoventilation: Secondary | ICD-10-CM | POA: Diagnosis present

## 2015-06-14 DIAGNOSIS — E785 Hyperlipidemia, unspecified: Secondary | ICD-10-CM | POA: Diagnosis present

## 2015-06-14 DIAGNOSIS — J9612 Chronic respiratory failure with hypercapnia: Secondary | ICD-10-CM | POA: Diagnosis not present

## 2015-06-14 DIAGNOSIS — K429 Umbilical hernia without obstruction or gangrene: Secondary | ICD-10-CM | POA: Diagnosis present

## 2015-06-14 DIAGNOSIS — N179 Acute kidney failure, unspecified: Secondary | ICD-10-CM | POA: Diagnosis present

## 2015-06-14 DIAGNOSIS — I480 Paroxysmal atrial fibrillation: Secondary | ICD-10-CM | POA: Diagnosis present

## 2015-06-14 DIAGNOSIS — Z66 Do not resuscitate: Secondary | ICD-10-CM | POA: Diagnosis not present

## 2015-06-14 DIAGNOSIS — G250 Essential tremor: Secondary | ICD-10-CM | POA: Diagnosis present

## 2015-06-14 DIAGNOSIS — I272 Other secondary pulmonary hypertension: Secondary | ICD-10-CM | POA: Diagnosis present

## 2015-06-14 DIAGNOSIS — I5032 Chronic diastolic (congestive) heart failure: Secondary | ICD-10-CM | POA: Diagnosis present

## 2015-06-14 DIAGNOSIS — J9622 Acute and chronic respiratory failure with hypercapnia: Secondary | ICD-10-CM | POA: Diagnosis present

## 2015-06-14 DIAGNOSIS — Z7901 Long term (current) use of anticoagulants: Secondary | ICD-10-CM

## 2015-06-14 DIAGNOSIS — B9689 Other specified bacterial agents as the cause of diseases classified elsewhere: Secondary | ICD-10-CM | POA: Diagnosis not present

## 2015-06-14 DIAGNOSIS — Z79899 Other long term (current) drug therapy: Secondary | ICD-10-CM

## 2015-06-14 DIAGNOSIS — I491 Atrial premature depolarization: Secondary | ICD-10-CM | POA: Diagnosis present

## 2015-06-14 DIAGNOSIS — I110 Hypertensive heart disease with heart failure: Secondary | ICD-10-CM | POA: Diagnosis present

## 2015-06-14 DIAGNOSIS — M726 Necrotizing fasciitis: Secondary | ICD-10-CM

## 2015-06-14 DIAGNOSIS — I878 Other specified disorders of veins: Secondary | ICD-10-CM | POA: Diagnosis present

## 2015-06-14 DIAGNOSIS — M47812 Spondylosis without myelopathy or radiculopathy, cervical region: Secondary | ICD-10-CM | POA: Diagnosis present

## 2015-06-14 DIAGNOSIS — G4700 Insomnia, unspecified: Secondary | ICD-10-CM | POA: Diagnosis present

## 2015-06-14 DIAGNOSIS — Z881 Allergy status to other antibiotic agents status: Secondary | ICD-10-CM

## 2015-06-14 DIAGNOSIS — G4733 Obstructive sleep apnea (adult) (pediatric): Secondary | ICD-10-CM | POA: Diagnosis present

## 2015-06-14 DIAGNOSIS — Z6841 Body Mass Index (BMI) 40.0 and over, adult: Secondary | ICD-10-CM | POA: Diagnosis not present

## 2015-06-14 DIAGNOSIS — R0600 Dyspnea, unspecified: Secondary | ICD-10-CM | POA: Diagnosis present

## 2015-06-14 DIAGNOSIS — A419 Sepsis, unspecified organism: Secondary | ICD-10-CM | POA: Diagnosis not present

## 2015-06-14 DIAGNOSIS — G9340 Encephalopathy, unspecified: Secondary | ICD-10-CM | POA: Diagnosis not present

## 2015-06-14 DIAGNOSIS — J9601 Acute respiratory failure with hypoxia: Secondary | ICD-10-CM | POA: Diagnosis present

## 2015-06-14 DIAGNOSIS — I509 Heart failure, unspecified: Secondary | ICD-10-CM

## 2015-06-14 DIAGNOSIS — R6520 Severe sepsis without septic shock: Secondary | ICD-10-CM

## 2015-06-14 DIAGNOSIS — Z885 Allergy status to narcotic agent status: Secondary | ICD-10-CM | POA: Diagnosis not present

## 2015-06-14 DIAGNOSIS — S81801A Unspecified open wound, right lower leg, initial encounter: Secondary | ICD-10-CM | POA: Diagnosis present

## 2015-06-14 LAB — BLOOD GAS, ARTERIAL
Acid-Base Excess: 17 mmol/L — ABNORMAL HIGH (ref 0.0–2.0)
BICARBONATE: 42.7 meq/L — AB (ref 20.0–24.0)
DELIVERY SYSTEMS: POSITIVE
Drawn by: 398661
Expiratory PAP: 5
FIO2: 0.8
Inspiratory PAP: 15
LHR: 15 {breaths}/min
MODE: POSITIVE
O2 Saturation: 99.2 %
Patient temperature: 98.6
TCO2: 44.8 mmol/L (ref 0–100)
pCO2 arterial: 68 mmHg (ref 35.0–45.0)
pH, Arterial: 7.414 (ref 7.350–7.450)
pO2, Arterial: 160 mmHg — ABNORMAL HIGH (ref 80.0–100.0)

## 2015-06-14 LAB — CBC WITH DIFFERENTIAL/PLATELET
BASOS ABS: 0 10*3/uL (ref 0.0–0.1)
Basophils Relative: 0 %
EOS PCT: 0 %
Eosinophils Absolute: 0 10*3/uL (ref 0.0–0.7)
HCT: 42.2 % (ref 36.0–46.0)
Hemoglobin: 12.4 g/dL (ref 12.0–15.0)
LYMPHS PCT: 4 %
Lymphs Abs: 0.6 10*3/uL — ABNORMAL LOW (ref 0.7–4.0)
MCH: 29.2 pg (ref 26.0–34.0)
MCHC: 29.4 g/dL — ABNORMAL LOW (ref 30.0–36.0)
MCV: 99.5 fL (ref 78.0–100.0)
Monocytes Absolute: 0.4 10*3/uL (ref 0.1–1.0)
Monocytes Relative: 3 %
Neutro Abs: 13.1 10*3/uL — ABNORMAL HIGH (ref 1.7–7.7)
Neutrophils Relative %: 93 %
PLATELETS: 134 10*3/uL — AB (ref 150–400)
RBC: 4.24 MIL/uL (ref 3.87–5.11)
RDW: 14.3 % (ref 11.5–15.5)
WBC: 14.1 10*3/uL — AB (ref 4.0–10.5)

## 2015-06-14 LAB — COMPREHENSIVE METABOLIC PANEL
ALT: 17 U/L (ref 14–54)
ANION GAP: 11 (ref 5–15)
AST: 31 U/L (ref 15–41)
Albumin: 2.6 g/dL — ABNORMAL LOW (ref 3.5–5.0)
Alkaline Phosphatase: 84 U/L (ref 38–126)
BILIRUBIN TOTAL: 0.9 mg/dL (ref 0.3–1.2)
BUN: 23 mg/dL — AB (ref 6–20)
CO2: 38 mmol/L — ABNORMAL HIGH (ref 22–32)
Calcium: 8.6 mg/dL — ABNORMAL LOW (ref 8.9–10.3)
Chloride: 89 mmol/L — ABNORMAL LOW (ref 101–111)
Creatinine, Ser: 1.04 mg/dL — ABNORMAL HIGH (ref 0.44–1.00)
GFR calc Af Amer: 60 mL/min — ABNORMAL LOW (ref >60)
GFR, EST NON AFRICAN AMERICAN: 52 mL/min — AB (ref >60)
Glucose, Bld: 125 mg/dL — ABNORMAL HIGH (ref 65–99)
POTASSIUM: 3.7 mmol/L (ref 3.5–5.1)
Sodium: 138 mmol/L (ref 135–145)
TOTAL PROTEIN: 6.1 g/dL — AB (ref 6.5–8.1)

## 2015-06-14 LAB — TYPE AND SCREEN
ABO/RH(D): A POS
Antibody Screen: NEGATIVE
UNIT DIVISION: 0
Unit division: 0

## 2015-06-14 LAB — I-STAT ARTERIAL BLOOD GAS, ED
ACID-BASE EXCESS: 11 mmol/L — AB (ref 0.0–2.0)
ACID-BASE EXCESS: 20 mmol/L — AB (ref 0.0–2.0)
BICARBONATE: 40.1 meq/L — AB (ref 20.0–24.0)
Bicarbonate: 48.4 mEq/L — ABNORMAL HIGH (ref 20.0–24.0)
O2 SAT: 93 %
O2 Saturation: 90 %
PH ART: 7.459 — AB (ref 7.350–7.450)
PO2 ART: 64 mmHg — AB (ref 80.0–100.0)
TCO2: 42 mmol/L (ref 0–100)
TCO2: >50 mmol/L (ref 0–100)
pCO2 arterial: 71.7 mmHg (ref 35.0–45.0)
pCO2 arterial: 68.2 mmHg (ref 35.0–45.0)
pH, Arterial: 7.355 (ref 7.350–7.450)
pO2, Arterial: 69 mmHg — ABNORMAL LOW (ref 80.0–100.0)

## 2015-06-14 LAB — I-STAT TROPONIN, ED: TROPONIN I, POC: 0 ng/mL (ref 0.00–0.08)

## 2015-06-14 LAB — URINE MICROSCOPIC-ADD ON

## 2015-06-14 LAB — I-STAT CG4 LACTIC ACID, ED: LACTIC ACID, VENOUS: 1.77 mmol/L (ref 0.5–2.0)

## 2015-06-14 LAB — BRAIN NATRIURETIC PEPTIDE: B NATRIURETIC PEPTIDE 5: 277.9 pg/mL — AB (ref 0.0–100.0)

## 2015-06-14 LAB — PROTIME-INR
INR: 2.76 — ABNORMAL HIGH (ref 0.00–1.49)
Prothrombin Time: 28.8 seconds — ABNORMAL HIGH (ref 11.6–15.2)

## 2015-06-14 LAB — URINALYSIS, ROUTINE W REFLEX MICROSCOPIC
BILIRUBIN URINE: NEGATIVE
GLUCOSE, UA: NEGATIVE mg/dL
KETONES UR: NEGATIVE mg/dL
Leukocytes, UA: NEGATIVE
Nitrite: NEGATIVE
PROTEIN: NEGATIVE mg/dL
Specific Gravity, Urine: 1.012 (ref 1.005–1.030)
pH: 5 (ref 5.0–8.0)

## 2015-06-14 LAB — APTT: APTT: 50 s — AB (ref 24–37)

## 2015-06-14 LAB — PROCALCITONIN: PROCALCITONIN: 0.91 ng/mL

## 2015-06-14 MED ORDER — VANCOMYCIN HCL 10 G IV SOLR
1500.0000 mg | INTRAVENOUS | Status: DC
  Filled 2015-06-14: qty 1500

## 2015-06-14 MED ORDER — SODIUM CHLORIDE 0.9% FLUSH
3.0000 mL | Freq: Two times a day (BID) | INTRAVENOUS | Status: DC
  Administered 2015-06-15 – 2015-06-20 (×10): 3 mL via INTRAVENOUS
  Administered 2015-06-21: 10:00:00 via INTRAVENOUS

## 2015-06-14 MED ORDER — CEFAZOLIN SODIUM 1-5 GM-% IV SOLN
1.0000 g | Freq: Once | INTRAVENOUS | Status: AC
  Administered 2015-06-14: 1 g via INTRAVENOUS
  Filled 2015-06-14: qty 50

## 2015-06-14 MED ORDER — DEXTROSE 5 % IV SOLN
2.0000 g | Freq: Two times a day (BID) | INTRAVENOUS | Status: DC
  Administered 2015-06-14: 2 g via INTRAVENOUS
  Filled 2015-06-14 (×3): qty 2

## 2015-06-14 MED ORDER — IPRATROPIUM-ALBUTEROL 0.5-2.5 (3) MG/3ML IN SOLN: 3.0000 mL | RESPIRATORY_TRACT | Status: DC | PRN

## 2015-06-14 MED ORDER — FUROSEMIDE 10 MG/ML IJ SOLN
INTRAMUSCULAR | Status: AC
Start: 2015-06-14 — End: 2015-06-14
  Filled 2015-06-14: qty 4

## 2015-06-14 MED ORDER — SODIUM CHLORIDE 0.9 % IV SOLN
2500.0000 mg | Freq: Once | INTRAVENOUS | Status: AC
  Administered 2015-06-14: 2500 mg via INTRAVENOUS
  Filled 2015-06-14: qty 2500

## 2015-06-14 MED ORDER — DEXTROSE 5 % IV SOLN
2.0000 g | Freq: Once | INTRAVENOUS | Status: AC
  Administered 2015-06-14: 2 g via INTRAVENOUS
  Filled 2015-06-14: qty 2

## 2015-06-14 MED ORDER — CETYLPYRIDINIUM CHLORIDE 0.05 % MT LIQD
7.0000 mL | Freq: Two times a day (BID) | OROMUCOSAL | Status: DC
  Administered 2015-06-15 – 2015-06-21 (×10): 7 mL via OROMUCOSAL

## 2015-06-14 MED ORDER — DULOXETINE HCL 30 MG PO CPEP
30.0000 mg | ORAL_CAPSULE | Freq: Every day | ORAL | Status: DC
  Administered 2015-06-14: 30 mg via ORAL
  Filled 2015-06-14: qty 1

## 2015-06-14 MED ORDER — FUROSEMIDE 10 MG/ML IJ SOLN: 80.0000 mg | Freq: Once | INTRAMUSCULAR | Status: DC

## 2015-06-14 MED ORDER — CHLORHEXIDINE GLUCONATE 0.12 % MT SOLN
15.0000 mL | Freq: Two times a day (BID) | OROMUCOSAL | Status: DC
Start: 2015-06-14 — End: 2015-06-21
  Administered 2015-06-14 – 2015-06-21 (×13): 15 mL via OROMUCOSAL
  Filled 2015-06-14 (×10): qty 15

## 2015-06-14 MED ORDER — FUROSEMIDE 10 MG/ML IJ SOLN
40.0000 mg | Freq: Once | INTRAMUSCULAR | Status: AC
  Administered 2015-06-14: 21:00:00 via INTRAVENOUS
  Administered 2015-06-14: 40 mg via INTRAVENOUS

## 2015-06-14 MED ORDER — VANCOMYCIN HCL IN DEXTROSE 1-5 GM/200ML-% IV SOLN
1000.0000 mg | Freq: Once | INTRAVENOUS | Status: DC
  Filled 2015-06-14: qty 200

## 2015-06-14 MED ORDER — DULOXETINE HCL 60 MG PO CPEP
60.0000 mg | ORAL_CAPSULE | Freq: Every day | ORAL | Status: DC
  Administered 2015-06-15: 60 mg via ORAL
  Filled 2015-06-14: qty 1

## 2015-06-14 MED ORDER — SIMVASTATIN 20 MG PO TABS
20.0000 mg | ORAL_TABLET | Freq: Every day | ORAL | Status: DC
Start: 2015-06-14 — End: 2015-06-21
  Administered 2015-06-14 – 2015-06-20 (×7): 20 mg via ORAL
  Filled 2015-06-14 (×7): qty 1

## 2015-06-14 MED ORDER — RIVAROXABAN 20 MG PO TABS: 20.0000 mg | ORAL_TABLET | Freq: Every day | ORAL | Status: DC

## 2015-06-14 MED ORDER — FUROSEMIDE 10 MG/ML IJ SOLN
80.0000 mg | INTRAMUSCULAR | Status: AC
  Administered 2015-06-14: 80 mg via INTRAVENOUS
  Filled 2015-06-14: qty 8

## 2015-06-14 NOTE — ED Provider Notes (Signed)
CSN: [id number]     Arrival date & time 06/14/15  1236 History   First MD Initiated Contact with Patient 06/14/15 1237     Chief Complaint  Patient presents with  . Shortness of Breath  . Leg Swelling     (Consider location/radiation/quality/duration/timing/severity/associated sxs/prior Treatment) HPI Comments: 75 year old female with extensive past medical history including atrial fibrillation, OSA, CHF, hypertension, morbid obesity who presents with respiratory distress. History obtained from EMS. Patient was brought from her rehabilitation facility this morning due to worsening shortness of breath. On EMS arrival, the patient was severely dyspneic and placed on CPAP. She was given albuterol in transport. She was noted to have bilateral leg swelling but significant left leg redness. She has been tachycardic in the 130s during transport. The patient denied any chest pain for them. She states she has a long history of shortness of breath and is unable to state when her shortness of breath worsened.  LEVEL 5 CAVEAT DUE TO RESPIRATORY DISTRESS  Patient is a 75 y.o. female presenting with shortness of breath. The history is provided by the EMS personnel.  Shortness of Breath   Past Medical History  Diagnosis Date  . Morbid obesity (HCC)   . Hypertension   . A-fib (HCC)   . Chronic anticoagulation   . Osteoarthritis   . Cervical spondylosis   . DDD (degenerative disc disease), cervical   . Fibromyalgia   . Chronic venous insufficiency   . Umbilical hernia   . Benign essential tremor   . Insomnia    Past Surgical History  Procedure Laterality Date  . Partial hysterectomy    . Inguinal hernia repair Right   . Colonoscopy N/A 06/12/2015    Procedure: COLONOSCOPY;  Surgeon: Ruffin Frederick, MD;  Location: [facility] ENDOSCOPY;  Service: Gastroenterology;  Laterality: N/A;   Family History  Problem Relation Age of Onset  . Heart attack Father 66    deceased  . Heart attack Brother    . Diabetes Mother    Social History  Substance Use Topics  . Smoking status: Never Smoker   . Smokeless tobacco: Never Used  . Alcohol Use: No   OB History    No data available     Review of Systems  Unable to perform ROS: Severe respiratory distress  Respiratory: Positive for shortness of breath.       Allergies  Hydromorphone; Morphine; Risperidone and related; Vancomycin; and Zolpidem  Home Medications   Prior to Admission medications   Medication Sig Start Date End Date Taking? Authorizing Provider  alendronate (FOSAMAX) 70 MG tablet Take 70 mg by mouth once a week. Take with a full glass of water on an empty stomach - on Sundays    Historical Provider, MD  ALPRAZolam Prudy Feeler) 0.5 MG tablet Take 0.5 mg by mouth at bedtime.     Historical Provider, MD  bag balm OINT ointment Apply 1 application topically 2 (two) times daily.     Historical Provider, MD  Calcium Citrate-Vitamin D (CITRACAL PETITES/VITAMIN D) 200-250 MG-UNIT TABS Take 1 tablet by mouth daily.    Historical Provider, MD  DULoxetine (CYMBALTA) 30 MG capsule Take 30 mg by mouth at bedtime. Also take 60 mg tablet every morning    Historical Provider, MD  Emollient Hinton Dyer) LOTN Apply 1 application topically 2 (two) times daily.    Historical Provider, MD  estradiol (ESTRACE) 0.1 MG/GM vaginal cream Place 1 Applicatorful vaginally at bedtime.    Historical Provider, MD  fluocinonide cream (  LIDEX) 0.05 % Apply 1 application topically 2 (two) times daily.    Historical Provider, MD  furosemide (LASIX) 80 MG tablet Take 80 mg by mouth 2 (two) times daily. Morning and afternoon    Historical Provider, MD  HYDROcodone-acetaminophen (NORCO/VICODIN) 5-325 MG tablet Take 0.5-1 tablets by mouth 4 (four) times daily. Take 1 tablet by mouth every morning, take 1/2 tablet at noon, in the evening and at bedtime    Historical Provider, MD  hydrocortisone (ANUSOL-HC) 25 MG suppository Place 1 suppository (25 mg total) rectally 2  (two) times daily. 06/12/15   Selina CooleyKyle Flores, MD  Melatonin 5 MG TABS Take 5 mg by mouth at bedtime.     Historical Provider, MD  metolazone (ZAROXOLYN) 2.5 MG tablet Take 2.5 mg by mouth 2 (two) times a week. Monday and Thursday    Historical Provider, MD  metoprolol succinate (TOPROL-XL) 50 MG 24 hr tablet Take 25 mg by mouth 2 (two) times daily. Take with or immediately following a meal.    Historical Provider, MD  Multiple Vitamin (MULTIVITAMIN WITH MINERALS) TABS tablet Take 1 tablet by mouth daily.    Historical Provider, MD  OXYGEN Inhale 2 L into the lungs continuous.     Historical Provider, MD  potassium chloride SA (K-DUR,KLOR-CON) 20 MEQ tablet Take 20 mEq by mouth daily.    Historical Provider, MD  rivaroxaban (XARELTO) 20 MG TABS tablet Take 20 mg by mouth daily with supper.    Historical Provider, MD  senna-docusate (SENNA-S) 8.6-50 MG tablet Take 2 tablets by mouth daily.     Historical Provider, MD  simvastatin (ZOCOR) 20 MG tablet Take 20 mg by mouth at bedtime.     Historical Provider, MD  Wheat Dextrin (BENEFIBER PO) Take 4 g by mouth 2 (two) times daily as needed (constipation).     Historical Provider, MD   BP 99/78 mmHg  Pulse 105  Temp(Src) 99.8 F (37.7 C) (Rectal)  Resp 18  SpO2 93% Physical Exam  Constitutional: She appears well-developed and well-nourished. She appears distressed.  Severely dyspneic w/ CPAP mask on  HENT:  Head: Normocephalic and atraumatic.  Eyes: Conjunctivae are normal. Pupils are equal, round, and reactive to light.  Neck: No tracheal deviation present.  Cardiovascular: Regular rhythm and normal heart sounds.  Tachycardia present.   No murmur heard. Pulmonary/Chest: She is in respiratory distress.  Diminished BS b/l, severe tachypnea  Abdominal: Soft. Bowel sounds are normal. She exhibits distension. There is no tenderness.  abd mildly distended  Musculoskeletal: She exhibits edema.  3+ pitting edema BLE to knees; erythema and warmth L lower  leg  Neurological:  Sleepy but arousable, able to follow basic commands  Skin: Skin is warm and dry.  Petechiae on left lower leg, erythematous and warm  Nursing note and vitals reviewed.   ED Course  .Critical Care Performed by: Laurence SpatesLITTLE, Laparis Durrett MORGAN Authorized by: Laurence SpatesLITTLE, Zyrus Hetland MORGAN Total critical care time: 60 minutes Critical care time was exclusive of separately billable procedures and treating other patients. Critical care was necessary to treat or prevent imminent or life-threatening deterioration of the following conditions: respiratory failure. Critical care was time spent personally by me on the following activities: development of treatment plan with patient or surrogate, discussions with consultants, evaluation of patient's response to treatment, examination of patient, obtaining history from patient or surrogate, ordering and performing treatments and interventions, ordering and review of laboratory studies, ordering and review of radiographic studies, pulse oximetry, re-evaluation of patient's condition and  review of old charts.   (including critical care time) Labs Review Labs Reviewed  COMPREHENSIVE METABOLIC PANEL - Abnormal; Notable for the following:    Chloride 89 (*)    CO2 38 (*)    Glucose, Bld 125 (*)    BUN 23 (*)    Creatinine, Ser 1.04 (*)    Calcium 8.6 (*)    Total Protein 6.1 (*)    Albumin 2.6 (*)    GFR calc non Af Amer 52 (*)    GFR calc Af Amer 60 (*)    All other components within normal limits  CBC WITH DIFFERENTIAL/PLATELET - Abnormal; Notable for the following:    WBC 14.1 (*)    MCHC 29.4 (*)    Platelets 134 (*)    Neutro Abs 13.1 (*)    Lymphs Abs 0.6 (*)    All other components within normal limits  I-STAT ARTERIAL BLOOD GAS, ED - Abnormal; Notable for the following:    pCO2 arterial 71.7 (*)    pO2, Arterial 64.0 (*)    Bicarbonate 40.1 (*)    Acid-Base Excess 11.0 (*)    All other components within normal limits  URINE  CULTURE  CULTURE, BLOOD (ROUTINE X 2)  CULTURE, BLOOD (ROUTINE X 2)  BRAIN NATRIURETIC PEPTIDE  URINALYSIS, ROUTINE W REFLEX MICROSCOPIC (NOT AT [facility])  PROTIME-INR  APTT  I-STAT TROPOININ, ED  I-STAT CG4 LACTIC ACID, ED    Imaging Review Dg Chest Port 1 View  06/14/2015  CLINICAL DATA:  Shortness of breath, tachypnea EXAM: PORTABLE CHEST 1 VIEW COMPARISON:  03/28/2015 FINDINGS: Study is limited by poor inspiration and patient's large body habitus. Cardiomegaly again noted. Again noted significant elevation of the left hemidiaphragm. There is central vascular congestion and mild interstitial prominence bilaterally highly suspicious for pulmonary edema. Bilateral basilar atelectasis or infiltrate. IMPRESSION: Cardiomegaly again noted. Again noted significant elevation of the left hemidiaphragm. There is central vascular congestion and mild interstitial prominence bilaterally highly suspicious for pulmonary edema. Bilateral basilar atelectasis or infiltrate. Electronically Signed   By: Natasha Mead M.D.   On: 06/14/2015 13:45   Dg Tibia/fibula Left Port  06/14/2015  CLINICAL DATA:  Shortness of breath. Redness and swelling of the left leg. Initial encounter. EXAM: PORTABLE LEFT TIBIA AND FIBULA - 2 VIEW COMPARISON:  None. FINDINGS: Diffuse skin thickening and subcutaneous reticulation which is nonspecific between infection or lymphedema. No soft tissue gas or opaque foreign body. No signs of osseous infection or fracture. IMPRESSION: Diffuse soft tissue swelling without acute osseous finding. Electronically Signed   By: Marnee Spring M.D.   On: 06/14/2015 13:46   Dg Femur Port Min 2 Views Left  06/14/2015  CLINICAL DATA:  Redness over her left leg EXAM: LEFT FEMUR PORTABLE 2 VIEWS COMPARISON:  None. FINDINGS: No acute fracture. No dislocation. Osteopenia. Mild degenerative change in the knee joint. IMPRESSION: No acute bony pathology. Electronically Signed   By: Jolaine Click M.D.   On: 06/14/2015 14:00    I have personally reviewed and evaluated these images and lab results as part of my medical decision-making.   EKG Interpretation   Date/Time:  Tuesday June 14 2015 12:44:05 EDT Ventricular Rate:  120 PR Interval:  100 QRS Duration: 118 QT Interval:  351 QTC Calculation: 496 R Axis:   61 Text Interpretation:  Sinus tachycardia Probable left atrial enlargement  Nonspecific intraventricular conduction delay Inferior infarct, old  Abnormal lateral Q waves No previous ECGs available Confirmed by Carolee Channell  MD,  Keatyn Jawad [phone]) on 06/14/2015 12:52:25 PM     Medications  ceFEPIme (MAXIPIME) 2 g in dextrose 5 % 50 mL IVPB (2 g Intravenous New Bag/Given 06/14/15 1400)  vancomycin (VANCOCIN) IVPB 1000 mg/200 mL premix (not administered)  furosemide (LASIX) injection 80 mg (not administered)  ceFAZolin (ANCEF) IVPB 1 g/50 mL premix (1 g Intravenous New Bag/Given 06/14/15 1309)    MDM   Final diagnoses:  SOB (shortness of breath)    Pt brought from daytime rehab facility for respiratory distress. She was placed on CPAP by EMS and on arrival, she was dyspneic w/ increased WOB, O2 sats stable on CPAP, heart rate in the 120s, hypotension at 88/40. She was able to answer basic questions and stated that her shortness of breath has been present for several years. EKG shows sinus tachycardia without obvious ischemic changes. Initial lactate and troponin normal. ABG shows pH 7.36, CO2 71, O2 64. Obtained blood and urine cultures given the patient's leg cellulitis and gave her Ancef. BNP is 278, however chest x-ray shows pulmonary edema and possible bilateral infiltrates. Broaden coverage with vancomycin and cefepime per pharmacy recommendations. Plain films of her leg are negative for soft tissue gas and given normal lactate, I am reassured against necrotizing fasciitis. On several reexaminations, the patient's work of breathing has improved on BiPAP and her heart rate has improved to normal. Gave the patient  80 mg IV Lasix. Her blood pressure has also improved to 115/54.  Spoke with the patient's sister over the phone who is her next of kin. She is unsure of her CODE STATUS. She does state that the patient has had a significant decline and she is concerned about her ability to care for herself as she lives alone.   I discussed with critical care, Dr. Molli Knock, who evaluated pt and recommended stepdown. Discussed w/ internal medicine teaching service, Dr. Yetta Barre, and patient admitted for further treatment.   Laurence Spates, MD 06/14/15 [phone]

## 2015-06-14 NOTE — ED Notes (Signed)
Admitting at bedside 

## 2015-06-14 NOTE — Progress Notes (Signed)
ANTICOAGULATION CONSULT NOTE - Initial Consult  Pharmacy Consult for Rivaroxaban Indication: atrial fibrillation  Allergies  Allergen Reactions  . Hydromorphone Other (See Comments)    Decreased B/P & HR  . Morphine Other (See Comments)    Decreased B/P & HR  . Risperidone And Related Other (See Comments)    Mentally confused/ drooling  . Vancomycin Other (See Comments)    May have caused renal issues  . Zolpidem Other (See Comments)    Confusion,  Dizziness, and hallucinations    Patient Measurements:   Heparin Dosing Weight:   Vital Signs: Temp: 99.9 F (37.7 C) (04/04 1830) Temp Source: Oral (04/04 1713) BP: 110/59 mmHg (04/04 1830) Pulse Rate: 103 (04/04 1830)  Labs:  Recent Labs  06/14/15 1252  HGB 12.4  HCT 42.2  PLT 134*  APTT 50*  LABPROT 28.8*  INR 2.76*  CREATININE 1.04*    Estimated Creatinine Clearance: 61.6 mL/min (by C-G formula based on Cr of 1.04).   Medical History: Past Medical History  Diagnosis Date  . Morbid obesity (HCC)   . Hypertension   . A-fib (HCC)   . Chronic anticoagulation   . Osteoarthritis   . Cervical spondylosis   . DDD (degenerative disc disease), cervical   . Fibromyalgia   . Chronic venous insufficiency   . Umbilical hernia   . Benign essential tremor   . Insomnia     Assessment: 75-yo Female presenting with shortness of breath and leg swelling.  PMH includes Atrial Fibrillation, hypertension, obesity, chronic venous insufficiency, fibromyalgia.  Discharged on 4/2 after treatment for hematochezia and melana.  On longterm anticoagulation with xarelto 20 mg daily for AFib.  Pharmacy consulted to dose xarelto at admission.  Pertinent Labs: Hgb 12.4, Plts 134, CrCl 61.6  Goal of Therapy:  Monitor platelets by anticoagulation protocol: Yes   Plan:  --Continue Xarelto 20 mg daily, with supper --Monitor CBC, renal function, and signs of bleeding  Kathlynn GrateAdam Lahela Woodin 06/14/2015,7:12 PM

## 2015-06-14 NOTE — ED Notes (Signed)
MD at bedside. 

## 2015-06-14 NOTE — Consult Note (Signed)
Name: Brown Humanthel M Spragg MRN: [id number] DOB: Feb 01, 1941    ADMISSION DATE:  06/14/2015 CONSULTATION DATE:  06/14/14  REFERRING MD :  EDP  CHIEF COMPLAINT:  Shortness of breath and leg swelling.  BRIEF PATIENT DESCRIPTION:  Misty Buckley, Misty Buckley, is a 75 year old female with h/o Morbid obesity, Hypertension, chronic anticoagulation for atrial fibrillation, Osteoartritis, cervical spondylosis, fibromyalgia, Chronic Venous insufficiency,umblical hernia,Insomnia was brought to ED on 4/4 with increased leg swelling and dyspnea.  SIGNIFICANT EVENTS  none  STUDIES:  none   HISTORY OF PRESENT ILLNESS:  Misty Buckley, Osha, is a 75 year old female with h/o Morbid obesity, Hypertension, chronic anticoagulation, Osteoartritis, cervical spondylosis, fibromyalgia, Chronic Venous insufficiency,umblical hernia, Beningn essential tremor, insomnia. Patient was discharged to rehabilitation facility on 4/2 after getting treated for hematochezia, dyschezia and melena.  Patient is on rivaroxaban for atrial fibrillation.  Patient was brought from the rehab facility due to worsening shortness of breath.  She was dyspneic and was placed on C-PAP,and she received albuterol treatment on the way to ER.  In the ER she was placed on BiPAP. Her ABG results from ER were 7.35/71.7/64/40.1. She was also noted to have bilateral leg swelling and significant left leg redness. Marland Kitchen.  PAST MEDICAL HISTORY :   has a past medical history of Morbid obesity (HCC); Hypertension; A-fib (HCC); Chronic anticoagulation; Osteoarthritis; Cervical spondylosis; DDD (degenerative disc disease), cervical; Fibromyalgia; Chronic venous insufficiency; Umbilical hernia; Benign essential tremor; and Insomnia.  has past surgical history that includes Partial hysterectomy; Inguinal hernia repair (Right); and Colonoscopy (N/A, 06/12/2015). Prior to Admission medications   Medication Sig Start Date End Date Taking? Authorizing Provider  alendronate (FOSAMAX) 70 MG tablet Take  70 mg by mouth once a week. Take with a full glass of water on an empty stomach - on Sundays   Yes Historical Provider, MD  ALPRAZolam Prudy Feeler(XANAX) 0.5 MG tablet Take 0.5 mg by mouth at bedtime.    Yes Historical Provider, MD  bag balm OINT ointment Apply 1 application topically 2 (two) times daily.    Yes Historical Provider, MD  Calcium Citrate-Vitamin D (CITRACAL PETITES/VITAMIN D) 200-250 MG-UNIT TABS Take 1 tablet by mouth daily.   Yes Historical Provider, MD  DULoxetine (CYMBALTA) 30 MG capsule Take 30 mg by mouth at bedtime. Also take 60 mg tablet every morning   Yes Historical Provider, MD  Emollient Hinton Dyer(LUBRIDERM) LOTN Apply 1 application topically 2 (two) times daily.   Yes Historical Provider, MD  estradiol (ESTRACE) 0.1 MG/GM vaginal cream Place 1 Applicatorful vaginally at bedtime.   Yes Historical Provider, MD  fluocinonide cream (LIDEX) 0.05 % Apply 1 application topically 2 (two) times daily.   Yes Historical Provider, MD  furosemide (LASIX) 80 MG tablet Take 80 mg by mouth 2 (two) times daily. Morning and afternoon   Yes Historical Provider, MD  HYDROcodone-acetaminophen (NORCO/VICODIN) 5-325 MG tablet Take 0.5-1 tablets by mouth 4 (four) times daily. Take 1 tablet by mouth every morning, take 1/2 tablet at noon, in the evening and at bedtime   Yes Historical Provider, MD  hydrocortisone (ANUSOL-HC) 25 MG suppository Place 1 suppository (25 mg total) rectally 2 (two) times daily. 06/12/15  Yes Selina CooleyKyle Flores, MD  Melatonin 5 MG TABS Take 5 mg by mouth at bedtime.    Yes Historical Provider, MD  metolazone (ZAROXOLYN) 2.5 MG tablet Take 2.5 mg by mouth 2 (two) times a week. Monday and Thursday   Yes Historical Provider, MD  metoprolol succinate (TOPROL-XL) 50 MG 24 hr tablet  Take 25 mg by mouth 2 (two) times daily. Take with or immediately following a meal.   Yes Historical Provider, MD  Multiple Vitamin (MULTIVITAMIN WITH MINERALS) TABS tablet Take 1 tablet by mouth daily.   Yes Historical  Provider, MD  OXYGEN Inhale 2 L into the lungs continuous.    Yes Historical Provider, MD  potassium chloride SA (K-DUR,KLOR-CON) 20 MEQ tablet Take 20 mEq by mouth daily.   Yes Historical Provider, MD  rivaroxaban (XARELTO) 20 MG TABS tablet Take 20 mg by mouth daily with supper.   Yes Historical Provider, MD  senna-docusate (SENNA-S) 8.6-50 MG tablet Take 2 tablets by mouth daily.    Yes Historical Provider, MD  simvastatin (ZOCOR) 20 MG tablet Take 20 mg by mouth at bedtime.    Yes Historical Provider, MD  Wheat Dextrin (BENEFIBER PO) Take 4 g by mouth 2 (two) times daily as needed (constipation).    Yes Historical Provider, MD   Allergies  Allergen Reactions  . Hydromorphone Other (See Comments)    Decreased B/P & HR  . Morphine Other (See Comments)    Decreased B/P & HR  . Risperidone And Related Other (See Comments)    Mentally confused/ drooling  . Vancomycin Other (See Comments)    May have caused renal issues  . Zolpidem Other (See Comments)    Confusion,  Dizziness, and hallucinations    FAMILY HISTORY:  family history includes Diabetes in her mother; Heart attack in her brother; Heart attack (age of onset: 39) in her father. SOCIAL HISTORY:  reports that she has never smoked. She has never used smokeless tobacco. She reports that she does not drink alcohol or use illicit drugs.  REVIEW OF SYSTEMS:   Constitutional: Negative for fever, chills, weight loss, malaise/fatigue and diaphoresis.  HENT: Negative for hearing loss, ear pain, nosebleeds, congestion, sore throat, neck pain, tinnitus and ear discharge.   Eyes: Negative for blurred vision, double vision, photophobia, pain, discharge and redness.  Respiratory: Negative for cough, hemoptysis, sputum production, shortness of breath, wheezing and stridor.   Cardiovascular: Negative for chest pain, palpitations, orthopnea, claudication, leg swelling and PND.  Gastrointestinal: Negative for heartburn, nausea, vomiting,  abdominal pain, diarrhea, constipation, blood in stool and melena.  Genitourinary: Negative for dysuria, urgency, frequency, hematuria and flank pain.  Musculoskeletal: Negative for myalgias, back pain, joint pain and falls.  Skin: Negative for itching and rash.  Neurological: Negative for dizziness, tingling, tremors, sensory change, speech change, focal weakness, seizures, loss of consciousness, weakness and headaches.  Endo/Heme/Allergies: Negative for environmental allergies and polydipsia. Does not bruise/bleed easily.  SUBJECTIVE: unable to talk due to BiPAP  VITAL SIGNS: Temp:  [98.8 F (37.1 C)-99.8 F (37.7 C)] 98.8 F (37.1 C) (04/04 1445) Pulse Rate:  [99-123] 104 (04/04 1430) Resp:  [16-34] 25 (04/04 1445) BP: (80-115)/(38-78) 105/68 mmHg (04/04 1445) SpO2:  [93 %-96 %] 94 % (04/04 1430)  PHYSICAL EXAMINATION: General:  Morbidly obese white female, sickly appearing found on BiPAP Neuro:  Wakes up when prompted, follows command HEENT: Atraumatic , normocephalic, no discharge Cardiovascular:  Irregular,no MRG noted Lungs: diminished bases,clear otherwise, no use of accessory muscle use. Abdomen: obese, nontender, good bowel sounds Musculoskeletal: edema bilateral extremities, redness Skin:  Wound on the right leg, dry,left leg redness, +3edema, warmth   Recent Labs Lab 06/10/15 1830 06/11/15 0635 06/14/15 1252  NA 140 138 138  K 4.0 3.5 3.7  CL 96* 95* 89*  CO2 35* 36* 38*  BUN 10 6  23*  CREATININE 0.86 0.84 1.04*  GLUCOSE 114* 104* 125*    Recent Labs Lab 06/10/15 1830 06/11/15 0635 06/14/15 1252  HGB 13.5 12.9 12.4  HCT 45.3 44.2 42.2  WBC 11.2* 10.3 14.1*  PLT 128* 128* 134*   Dg Chest Port 1 View  06/14/2015  CLINICAL DATA:  Shortness of breath, tachypnea EXAM: PORTABLE CHEST 1 VIEW COMPARISON:  03/28/2015 FINDINGS: Study is limited by poor inspiration and patient's large body habitus. Cardiomegaly again noted. Again noted significant elevation of  the left hemidiaphragm. There is central vascular congestion and mild interstitial prominence bilaterally highly suspicious for pulmonary edema. Bilateral basilar atelectasis or infiltrate. IMPRESSION: Cardiomegaly again noted. Again noted significant elevation of the left hemidiaphragm. There is central vascular congestion and mild interstitial prominence bilaterally highly suspicious for pulmonary edema. Bilateral basilar atelectasis or infiltrate. Electronically Signed   By: Natasha Mead M.D.   On: 06/14/2015 13:45   Dg Tibia/fibula Left Port  06/14/2015  CLINICAL DATA:  Shortness of breath. Redness and swelling of the left leg. Initial encounter. EXAM: PORTABLE LEFT TIBIA AND FIBULA - 2 VIEW COMPARISON:  None. FINDINGS: Diffuse skin thickening and subcutaneous reticulation which is nonspecific between infection or lymphedema. No soft tissue gas or opaque foreign body. No signs of osseous infection or fracture. IMPRESSION: Diffuse soft tissue swelling without acute osseous finding. Electronically Signed   By: Marnee Spring M.D.   On: 06/14/2015 13:46   Dg Femur Port Min 2 Views Left  06/14/2015  CLINICAL DATA:  Redness over her left leg EXAM: LEFT FEMUR PORTABLE 2 VIEWS COMPARISON:  None. FINDINGS: No acute fracture. No dislocation. Osteopenia. Mild degenerative change in the knee joint. IMPRESSION: No acute bony pathology. Electronically Signed   By: Jolaine Click M.D.   On: 06/14/2015 14:00    ASSESSMENT / PLAN: A  Acute hypoxic respiratory failure  HCAP  P Continue BiPAP to keep sats> 88% Continue Vanc/cefipime ABGs as needed.  A Chronic venous insufficiency Cellulitis DVT highly unlikely due to Xarelto for afib  P Change cefazolin to cefepime. 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  Was started on BiPAP.  BP was marginal but increased once respiratory status improved.  On exam, bibasilar crackles.  I reviewed CXR myself, elevated hemidiaphragm and some pulmonary edema.  Discussed with PCCM-NP.    Acute on chronic respiratory failure:  - BiPAP to PRN.  - Gentle diureses.  Hypoxemia:  - Titrate O2 for sat of 88-92%.  - Ambulatory desat upon discharge for level of home O2.  Elevated L hemidiaphragm:  - Sniff test when improved.  Cellulitis:  - Vanc/zosyn.  - D/C Cefazolin.  - F/U on culture.  - Recommend PCT.  AMS: due to infection and respiratory failure in the elderly.  - Monitor.  - BiPAP.  - Abx.  Code discussion: when family is available, recommend discussing code status given the patient's overall debility.  PCCM will follow.  Patient seen and examined, agree with above note.  I dictated the care and orders written for this patient under my direction.  Alyson Reedy, MD [phone]

## 2015-06-14 NOTE — ED Notes (Addendum)
Per EMS- [facility] for eval of left leg swelling and reddness. Pt was noted to have increased SOB. Pt was placed on CPAP and given 5mg  of albuterol en route. Pt also noted to have bilateral leg swelling. Worse on the left with redness. Pt also reported to have dark urine.  BP108/70 HR 130. Pt placed on BiPap on arrival to ED

## 2015-06-14 NOTE — Progress Notes (Signed)
Pharmacy Antibiotic Note  Misty Buckley is a 75 y.o. female admitted on 06/14/2015 with cellulitis and possible HCAP.  Pharmacy has been consulted for cefepime and vancomycin dosing. Ancef 1 gm given at 1300 for cellulitis and CXR now concerning for HCAP - to broaden to cefepime/vanc.  WBC 14.1, creat 1.04, lactate WNL, temp 99.8. Wt 121.6 kg. Normalized creat cl 54  ml/min.   Plan: cefepime 2 gm q12h Vancomycin 2500 mg IV loading dose followed by 1500 mg IV q24 per obesity nomogram for VT goal 15-20 for HCAP F/u renal fxn, wbc, temp, culture data VT as needed    Temp (24hrs), Avg:99.8 F (37.7 C), Min:99.8 F (37.7 C), Max:99.8 F (37.7 C)   Recent Labs Lab 06/10/15 1830 06/11/15 0635 06/14/15 1252 06/14/15 1259  WBC 11.2* 10.3 14.1*  --   CREATININE 0.86 0.84 1.04*  --   LATICACIDVEN  --   --   --  1.77    Estimated Creatinine Clearance: 61.6 mL/min (by C-G formula based on Cr of 1.04).    Allergies  Allergen Reactions  . Hydromorphone Other (See Comments)    Decreased B/P & HR  . Morphine Other (See Comments)    Decreased B/P & HR  . Risperidone And Related Other (See Comments)    Mentally confused/ drooling  . Vancomycin Other (See Comments)    May have caused renal issues  . Zolpidem Other (See Comments)    Confusion,  Dizziness, and hallucinations    Antimicrobials this admission: Ancef 4/4 x 1 dose Cefepime 4/4>> vanc 4/4>>  Dose adjustments this admission:   Microbiology results: 4/4 Ucx>> 4/4 BCx2>> MRSA PCR neg  Thank you for allowing pharmacy to be a part of this patient's care.  Herby AbrahamMichelle T. Thorn Demas, Pharm.D. [phone] 06/14/2015 2:06 PM

## 2015-06-14 NOTE — H&P (Signed)
Date: 06/14/2015               Patient Name:  Misty Buckley MRN: [id number]  DOB: Feb 14, 1941 Age / Sex: 75 y.o., female   PCP: Jethro Bastos, MD         Medical Service: Internal Medicine Teaching Service         Attending Physician: Dr. Levert Feinstein, MD    First Contact: Dr. Loney Loh Pager: [phone]  Second Contact: Dr. Beckie Salts Pager: [phone]       After Hours (After 5p/  First Contact Pager: [phone]  weekends / holidays): Second Contact Pager: [phone]   Chief Complaint: SOB, leg swelling   History of Present Illness: Patient is a 75 yo F with a PMHx of A-fib, OSA, CHF, HTN, morbid obesity presenting from her rehabilitation facility due to worsening shortness of breath. Patient states she has a long history of shortness of breath and is unable to state when her shortness of breath worsened. States she uses 3-4 L O2 via [place] at home. She denies having any chest pain or cough. Reports having swelling in her legs for 3 weeks and does admit to having symptoms of orthopnea. She also mentions noticing increased erythema on her left lower extremity yesterday and reports having pain in this extremity. Denies having any fevers, chills, nausea, or vomiting. States her abdomen is "sore" expecially on the right side, last bowel movement was yesterday. Denies having any melena or hematochezia. No further history could be obtained as patient had increased work of breathing.   On EMS arrival, the patient was severely dyspneic and placed on CPAP. She was given albuterol in transport. She had been tachycardic in the 130s during transport.   Patient was recently hospitalized 06/10/15 - 06/12/15 for a 1 month of progressively worsening hematochezia, dyschezia, and melena while on rivaroxaban for pn admission she was hemodynamically stable and her hemoglobin was noaroxysmal atrial fibrillation. Colonoscopy at the time showed non-bleeding internal hemorrhoids and non-bleeding diverticulosis.    Meds: Current Facility-Administered Medications  Medication Dose Route Frequency Provider Last Rate Last Dose  . ceFEPIme (MAXIPIME) 2 g in dextrose 5 % 50 mL IVPB  2 g Intravenous Q12H Herby Abraham, RPH      . [START ON 06/15/2015] vancomycin (VANCOCIN) 1,500 mg in sodium chloride 0.9 % 500 mL IVPB  1,500 mg Intravenous Q24H Herby Abraham, [facility]       Current Outpatient Prescriptions  Medication Sig Dispense Refill  . alendronate (FOSAMAX) 70 MG tablet Take 70 mg by mouth once a week. Take with a full glass of water on an empty stomach - on Sundays    . ALPRAZolam (XANAX) 0.5 MG tablet Take 0.5 mg by mouth at bedtime.     . bag balm OINT ointment Apply 1 application topically 2 (two) times daily.     . Calcium Citrate-Vitamin D (CITRACAL PETITES/VITAMIN D) 200-250 MG-UNIT TABS Take 1 tablet by mouth daily.    . DULoxetine (CYMBALTA) 30 MG capsule Take 30 mg by mouth at bedtime. Also take 60 mg tablet every morning    . Emollient (LUBRIDERM) LOTN Apply 1 application topically 2 (two) times daily.    Marland Kitchen estradiol (ESTRACE) 0.1 MG/GM vaginal cream Place 1 Applicatorful vaginally at bedtime.    . fluocinonide cream (LIDEX) 0.05 % Apply 1 application topically 2 (two) times daily.    . furosemide (LASIX) 80 MG tablet Take 80 mg by mouth 2 (two) times daily. Morning and  afternoon    . HYDROcodone-acetaminophen (NORCO/VICODIN) 5-325 MG tablet Take 0.5-1 tablets by mouth 4 (four) times daily. Take 1 tablet by mouth every morning, take 1/2 tablet at noon, in the evening and at bedtime    . hydrocortisone (ANUSOL-HC) 25 MG suppository Place 1 suppository (25 mg total) rectally 2 (two) times daily. 12 suppository 0  . Melatonin 5 MG TABS Take 5 mg by mouth at bedtime.     . metolazone (ZAROXOLYN) 2.5 MG tablet Take 2.5 mg by mouth 2 (two) times a week. Monday and Thursday    . metoprolol succinate (TOPROL-XL) 50 MG 24 hr tablet Take 25 mg by mouth 2 (two) times daily. Take with or immediately  following a meal.    . Multiple Vitamin (MULTIVITAMIN WITH MINERALS) TABS tablet Take 1 tablet by mouth daily.    . OXYGEN Inhale 2 L into the lungs continuous.     . potassium chloride SA (K-DUR,KLOR-CON) 20 MEQ tablet Take 20 mEq by mouth daily.    . rivaroxaban (XARELTO) 20 MG TABS tablet Take 20 mg by mouth daily with supper.    . senna-docusate (SENNA-S) 8.6-50 MG tablet Take 2 tablets by mouth daily.     . simvastatin (ZOCOR) 20 MG tablet Take 20 mg by mouth at bedtime.     . Wheat Dextrin (BENEFIBER PO) Take 4 g by mouth 2 (two) times daily as needed (constipation).       Allergies: Allergies as of 06/14/2015 - Review Complete 06/14/2015  Allergen Reaction Noted  . Hydromorphone Other (See Comments) 06/11/2015  . Morphine Other (See Comments) 06/11/2015  . Risperidone and related Other (See Comments) 06/08/2015  . Vancomycin Other (See Comments) 06/11/2015  . Zolpidem Other (See Comments) 06/11/2015   Past Medical History  Diagnosis Date  . Morbid obesity (HCC)   . Hypertension   . A-fib (HCC)   . Chronic anticoagulation   . Osteoarthritis   . Cervical spondylosis   . DDD (degenerative disc disease), cervical   . Fibromyalgia   . Chronic venous insufficiency   . Umbilical hernia   . Benign essential tremor   . Insomnia    Past Surgical History  Procedure Laterality Date  . Partial hysterectomy    . Inguinal hernia repair Right   . Colonoscopy N/A 06/12/2015    Procedure: COLONOSCOPY;  Surgeon: Ruffin Frederick, MD;  Location: [facility] ENDOSCOPY;  Service: Gastroenterology;  Laterality: N/A;   Family History  Problem Relation Age of Onset  . Heart attack Father 41    deceased  . Heart attack Brother   . Diabetes Mother    Social History   Social History  . Marital Status: Divorced    Spouse Name: N/A  . Number of Children: 4  . Years of Education: N/A   Occupational History  . retired     Social History Main Topics  . Smoking status: Never Smoker   .  Smokeless tobacco: Never Used  . Alcohol Use: No  . Drug Use: No  . Sexual Activity: Not on file   Other Topics Concern  . Not on file   Social History Narrative    Review of Systems: Review of Systems  Constitutional: Positive for malaise/fatigue. Negative for fever and chills.  HENT: Negative for congestion and sore throat.   Eyes: Negative for pain and discharge.  Respiratory: Positive for shortness of breath. Negative for cough, sputum production and wheezing.   Cardiovascular: Positive for orthopnea and leg swelling. Negative for  chest pain.  Gastrointestinal: Positive for abdominal pain. Negative for nausea, vomiting, diarrhea, constipation, blood in stool and melena.  Skin: Positive for rash.  Neurological: Negative for sensory change and focal weakness.   Physical Exam: Blood pressure 110/59, pulse 103, temperature 99.9 F (37.7 C), temperature source Rectal, resp. rate 28, SpO2 90 %. Physical Exam  Constitutional: She is oriented to person, place, and time. She appears well-developed and well-nourished. She appears distressed.  HENT:  Head: Normocephalic and atraumatic.  Eyes: EOM are normal.  Neck: Neck supple. No tracheal deviation present.  Cardiovascular: Normal rate, regular rhythm and intact distal pulses.  Exam reveals no gallop and no friction rub.   Pulmonary/Chest: She is in respiratory distress. She has no wheezes. She has no rales.  Anterior lung fields clear to auscultation bilaterally. Increased work of breathing.  Abdominal: Soft. Bowel sounds are normal. She exhibits no distension. There is tenderness. There is no rebound and no guarding.  Mild tenderness to palpation of the right lower quadrant.   Genitourinary:  Foley catheter in place.   Musculoskeletal: She exhibits edema.  3+ edema of bilateral lower extremities  Neurological: She is alert and oriented to person, place, and time.  Skin: Skin is warm and dry. She is not diaphoretic.  Increased  erythema and warmth of the LLE extending up to the lateral and the medial aspect of the thigh up to groin level.     Lab results: Basic Metabolic Panel:  Recent Labs  19/14/78 1252  NA 138  K 3.7  CL 89*  CO2 38*  GLUCOSE 125*  BUN 23*  CREATININE 1.04*  CALCIUM 8.6*   Liver Function Tests:  Recent Labs  06/14/15 1252  AST 31  ALT 17  ALKPHOS 84  BILITOT 0.9  PROT 6.1*  ALBUMIN 2.6*   No results for input(s): LIPASE, AMYLASE in the last 72 hours. No results for input(s): AMMONIA in the last 72 hours. CBC:  Recent Labs  06/14/15 1252  WBC 14.1*  NEUTROABS 13.1*  HGB 12.4  HCT 42.2  MCV 99.5  PLT 134*   Cardiac Enzymes: No results for input(s): CKTOTAL, CKMB, CKMBINDEX, TROPONINI in the last 72 hours. BNP: No results for input(s): PROBNP in the last 72 hours. D-Dimer: No results for input(s): DDIMER in the last 72 hours. CBG: No results for input(s): GLUCAP in the last 72 hours. Hemoglobin A1C: No results for input(s): HGBA1C in the last 72 hours. Fasting Lipid Panel: No results for input(s): CHOL, HDL, LDLCALC, TRIG, CHOLHDL, LDLDIRECT in the last 72 hours. Thyroid Function Tests: No results for input(s): TSH, T4TOTAL, FREET4, T3FREE, THYROIDAB in the last 72 hours. Anemia Panel: No results for input(s): VITAMINB12, FOLATE, FERRITIN, TIBC, IRON, RETICCTPCT in the last 72 hours. Coagulation:  Recent Labs  06/14/15 1252  LABPROT 28.8*  INR 2.76*   Urine Drug Screen: Drugs of Abuse  No results found for: LABOPIA, COCAINSCRNUR, LABBENZ, AMPHETMU, THCU, LABBARB  Alcohol Level: No results for input(s): ETH in the last 72 hours. Urinalysis:  Recent Labs  06/14/15 1351  COLORURINE AMBER*  LABSPEC 1.012  PHURINE 5.0  GLUCOSEU NEGATIVE  HGBUR TRACE*  BILIRUBINUR NEGATIVE  KETONESUR NEGATIVE  PROTEINUR NEGATIVE  NITRITE NEGATIVE  LEUKOCYTESUR NEGATIVE   Misc. Labs:   Imaging results:  Dg Chest Port 1 View  06/14/2015  CLINICAL DATA:   Shortness of breath, tachypnea EXAM: PORTABLE CHEST 1 VIEW COMPARISON:  03/28/2015 FINDINGS: Study is limited by poor inspiration and patient's large body habitus.  Cardiomegaly again noted. Again noted significant elevation of the left hemidiaphragm. There is central vascular congestion and mild interstitial prominence bilaterally highly suspicious for pulmonary edema. Bilateral basilar atelectasis or infiltrate. IMPRESSION: Cardiomegaly again noted. Again noted significant elevation of the left hemidiaphragm. There is central vascular congestion and mild interstitial prominence bilaterally highly suspicious for pulmonary edema. Bilateral basilar atelectasis or infiltrate. Electronically Signed   By: Natasha Mead M.D.   On: 06/14/2015 13:45   Dg Tibia/fibula Left Port  06/14/2015  CLINICAL DATA:  Shortness of breath. Redness and swelling of the left leg. Initial encounter. EXAM: PORTABLE LEFT TIBIA AND FIBULA - 2 VIEW COMPARISON:  None. FINDINGS: Diffuse skin thickening and subcutaneous reticulation which is nonspecific between infection or lymphedema. No soft tissue gas or opaque foreign body. No signs of osseous infection or fracture. IMPRESSION: Diffuse soft tissue swelling without acute osseous finding. Electronically Signed   By: Marnee Spring M.D.   On: 06/14/2015 13:46   Dg Femur Port Min 2 Views Left  06/14/2015  CLINICAL DATA:  Redness over her left leg EXAM: LEFT FEMUR PORTABLE 2 VIEWS COMPARISON:  None. FINDINGS: No acute fracture. No dislocation. Osteopenia. Mild degenerative change in the knee joint. IMPRESSION: No acute bony pathology. Electronically Signed   By: Jolaine Click M.D.   On: 06/14/2015 14:00    Other results: EKG: Sinus tachycardia (HR 120). No acute ST or T wave changes.   Assessment & Plan by Problem: Active Problems:   CHF (congestive heart failure) (HCC)  Dyspnea Patient is having bilateral lower extremity edema and has orthopnea. She is presenting with worsening dyspnea.  Currently on Lasix 80 mg BID at home. CXR showing central vascular congestion and mild interstitial prominence bilaterally suspicious for pulmonary edema. Also showing bilateral basilar atelectasis or infiltrate. ABG showing pH 7.35, pCO2 71.7, and pO2 64. Labs showing high bicarb (38) and bicarb is chronically high consistent with CO2 retention. LFTs normal. White count elevated at 14.1. Echo from [facility] (03/2015) showing LVEF 55-60% but previous notes mention severe pulmonary HTN and diastolic heart failure. Sleep study done 05/17/15 was normal. Patient reports having COPD but no PFTs on file. PE less likely as patient is currently on Xarelto for A-fib and her INR is currently therapeutic. Not likely ACS as patient is not complaining of any chest pain. Her current presentation could be due to PNA vs COPD exacerbation vs CHF exacerbation. She was given Lasix 80 mg once in the ED. -Admit to stepdown -O2 therapy to keep O2 sat 88-92% -Bipap prn  -Vancomycin per pharmacy -Cefepime per pharmacy  -Duonebs  -Hold further lasix until morning  -PT eval and treat -F/u am EKG -Pending blood cx and urine cx  -CXR in am  -STAT procalcitonin pending  -F/u am labs: CBC, BMP, procalcitonin  -Flu panel pending   Cellulitis  LLE noted to have increased erythema and warmth extending up her thigh to the groin. White count elevated at 14.1. -Continue Vancomycin and Cefepime as above -Wound care consult   AKI SCr 1.0 with baseline 0.8. Likely in the setting of diuresis. Patient is on Lasix 80 mg BID at home.  -Continue to monitor   HFpEF Echo from [facility] (03/2015) showing LVEF 55-60% but previous notes mention severe pulmonary HTN and diastolic heart failure. Patient received a dose of Lasix 80 mg in the ED. She has diuresed 2L since admission.  -Hold further Lasix until morning  Paroxysmal A-fib EKG showing NSR at this time.  -Continue Xarelto  per pharmacy   HLD -Continue home med Zocor 20 mg daily    Depression -Continue home med Cymbalta 30 mg daily   DVT/PE ppx: Xarelto per pharmacy   Diet: NPO  Code: Goals of care discussion with patient and family needed.   Dispo: Disposition is deferred at this time, awaiting improvement of current medical problems. Anticipated discharge in approximately 2-3 day(s).   The patient does have a current PCP Jethro Bastos(Robert N Koehler, MD) and [facility].  The patient does not have transportation limitations that hinder transportation to clinic appointments.  Signed: John GiovanniVasundhra Trentyn Boisclair, MD 06/14/2015, 6:34 PM

## 2015-06-14 NOTE — ED Notes (Signed)
Pt's sister left her cell number Bobette Moebbie Morgan [phone]

## 2015-06-14 NOTE — Progress Notes (Signed)
MD wanted to try pt off of the bi-pap and on a venti mask. Pt is currently on a 40% venturi mask. RT will continue to monitor.

## 2015-06-15 ENCOUNTER — Inpatient Hospital Stay (HOSPITAL_COMMUNITY): Payer: Medicare (Managed Care)

## 2015-06-15 ENCOUNTER — Encounter (HOSPITAL_COMMUNITY): Payer: Self-pay | Admitting: *Deleted

## 2015-06-15 DIAGNOSIS — E669 Obesity, unspecified: Secondary | ICD-10-CM

## 2015-06-15 DIAGNOSIS — E785 Hyperlipidemia, unspecified: Secondary | ICD-10-CM

## 2015-06-15 DIAGNOSIS — E872 Acidosis: Secondary | ICD-10-CM

## 2015-06-15 DIAGNOSIS — N179 Acute kidney failure, unspecified: Secondary | ICD-10-CM

## 2015-06-15 DIAGNOSIS — J9601 Acute respiratory failure with hypoxia: Secondary | ICD-10-CM

## 2015-06-15 DIAGNOSIS — I480 Paroxysmal atrial fibrillation: Secondary | ICD-10-CM

## 2015-06-15 DIAGNOSIS — F329 Major depressive disorder, single episode, unspecified: Secondary | ICD-10-CM

## 2015-06-15 DIAGNOSIS — Z7901 Long term (current) use of anticoagulants: Secondary | ICD-10-CM

## 2015-06-15 DIAGNOSIS — B9689 Other specified bacterial agents as the cause of diseases classified elsewhere: Secondary | ICD-10-CM

## 2015-06-15 DIAGNOSIS — R600 Localized edema: Secondary | ICD-10-CM

## 2015-06-15 DIAGNOSIS — I5032 Chronic diastolic (congestive) heart failure: Secondary | ICD-10-CM

## 2015-06-15 DIAGNOSIS — Z79899 Other long term (current) drug therapy: Secondary | ICD-10-CM

## 2015-06-15 DIAGNOSIS — I872 Venous insufficiency (chronic) (peripheral): Secondary | ICD-10-CM

## 2015-06-15 DIAGNOSIS — R0600 Dyspnea, unspecified: Secondary | ICD-10-CM

## 2015-06-15 DIAGNOSIS — L03116 Cellulitis of left lower limb: Principal | ICD-10-CM

## 2015-06-15 DIAGNOSIS — J9602 Acute respiratory failure with hypercapnia: Secondary | ICD-10-CM

## 2015-06-15 LAB — CBC
HCT: 39.9 % (ref 36.0–46.0)
Hemoglobin: 11.9 g/dL — ABNORMAL LOW (ref 12.0–15.0)
MCH: 29 pg (ref 26.0–34.0)
MCHC: 29.8 g/dL — ABNORMAL LOW (ref 30.0–36.0)
MCV: 97.3 fL (ref 78.0–100.0)
PLATELETS: 121 10*3/uL — AB (ref 150–400)
RBC: 4.1 MIL/uL (ref 3.87–5.11)
RDW: 14.2 % (ref 11.5–15.5)
WBC: 13.5 10*3/uL — AB (ref 4.0–10.5)

## 2015-06-15 LAB — PROTIME-INR
INR: 1.87 — ABNORMAL HIGH (ref 0.00–1.49)
PROTHROMBIN TIME: 21.4 s — AB (ref 11.6–15.2)

## 2015-06-15 LAB — BASIC METABOLIC PANEL
ANION GAP: 12 (ref 5–15)
BUN: 19 mg/dL (ref 6–20)
CALCIUM: 8.2 mg/dL — AB (ref 8.9–10.3)
CO2: 42 mmol/L — ABNORMAL HIGH (ref 22–32)
Chloride: 87 mmol/L — ABNORMAL LOW (ref 101–111)
Creatinine, Ser: 0.75 mg/dL (ref 0.44–1.00)
GFR calc Af Amer: >60 mL/min (ref >60)
GFR calc non Af Amer: >60 mL/min (ref >60)
GLUCOSE: 106 mg/dL — AB (ref 65–99)
Potassium: 3.3 mmol/L — ABNORMAL LOW (ref 3.5–5.1)
SODIUM: 141 mmol/L (ref 135–145)

## 2015-06-15 LAB — MAGNESIUM: Magnesium: 1.7 mg/dL (ref 1.7–2.4)

## 2015-06-15 LAB — MRSA PCR SCREENING: MRSA BY PCR: POSITIVE — AB

## 2015-06-15 LAB — INFLUENZA PANEL BY PCR (TYPE A & B)
H1N1 flu by pcr: NOT DETECTED
INFLAPCR: NEGATIVE
Influenza B By PCR: NEGATIVE

## 2015-06-15 LAB — URINE CULTURE
CULTURE: NO GROWTH
Special Requests: NORMAL

## 2015-06-15 MED ORDER — SODIUM CHLORIDE 0.9 % IV SOLN
3.0000 g | Freq: Four times a day (QID) | INTRAVENOUS | Status: DC
  Administered 2015-06-15 – 2015-06-21 (×24): 3 g via INTRAVENOUS
  Filled 2015-06-15 (×28): qty 3

## 2015-06-15 MED ORDER — CHLORHEXIDINE GLUCONATE CLOTH 2 % EX PADS
6.0000 | MEDICATED_PAD | Freq: Every day | CUTANEOUS | Status: AC
  Administered 2015-06-15 – 2015-06-19 (×5): 6 via TOPICAL

## 2015-06-15 MED ORDER — LIDOCAINE 5 % EX PTCH
1.0000 | MEDICATED_PATCH | CUTANEOUS | Status: DC
  Administered 2015-06-16 – 2015-06-20 (×4): 1 via TRANSDERMAL
  Filled 2015-06-15 (×7): qty 1

## 2015-06-15 MED ORDER — ACETAMINOPHEN 500 MG PO TABS
500.0000 mg | ORAL_TABLET | ORAL | Status: DC | PRN
  Administered 2015-06-15 – 2015-06-16 (×2): 500 mg via ORAL
  Filled 2015-06-15 (×2): qty 1

## 2015-06-15 MED ORDER — FENTANYL CITRATE (PF) 100 MCG/2ML IJ SOLN
12.5000 ug | Freq: Once | INTRAMUSCULAR | Status: AC
  Administered 2015-06-15: 12.5 ug via INTRAVENOUS

## 2015-06-15 MED ORDER — IOPAMIDOL (ISOVUE-370) INJECTION 76%
INTRAVENOUS | Status: AC
  Administered 2015-06-15: 100 mL
  Filled 2015-06-15: qty 100

## 2015-06-15 MED ORDER — FENTANYL CITRATE (PF) 100 MCG/2ML IJ SOLN
INTRAMUSCULAR | Status: AC
  Filled 2015-06-15: qty 2

## 2015-06-15 MED ORDER — ENOXAPARIN SODIUM 120 MG/0.8ML ~~LOC~~ SOLN
1.0000 mg/kg | Freq: Two times a day (BID) | SUBCUTANEOUS | Status: AC
  Administered 2015-06-15 – 2015-06-17 (×6): 115 mg via SUBCUTANEOUS
  Filled 2015-06-15 (×6): qty 0.77

## 2015-06-15 MED ORDER — ONDANSETRON HCL 4 MG/2ML IJ SOLN: 4.0000 mg | Freq: Four times a day (QID) | INTRAMUSCULAR | Status: DC | PRN

## 2015-06-15 MED ORDER — ONDANSETRON HCL 4 MG/2ML IJ SOLN
INTRAMUSCULAR | Status: AC
  Administered 2015-06-15: 20:00:00
  Filled 2015-06-15: qty 2

## 2015-06-15 MED ORDER — GADOBENATE DIMEGLUMINE 529 MG/ML IV SOLN
20.0000 mL | Freq: Once | INTRAVENOUS | Status: AC | PRN
  Administered 2015-06-15: 20 mL via INTRAVENOUS

## 2015-06-15 MED ORDER — KETOROLAC TROMETHAMINE 30 MG/ML IJ SOLN
30.0000 mg | Freq: Three times a day (TID) | INTRAMUSCULAR | Status: DC | PRN
  Administered 2015-06-15 – 2015-06-16 (×2): 30 mg via INTRAVENOUS
  Filled 2015-06-15 (×2): qty 1

## 2015-06-15 MED ORDER — SODIUM CHLORIDE 0.45 % IV BOLUS
500.0000 mL | Freq: Once | INTRAVENOUS | Status: AC
  Administered 2015-06-15: 500 mL via INTRAVENOUS

## 2015-06-15 MED ORDER — LINEZOLID 600 MG/300ML IV SOLN
600.0000 mg | Freq: Two times a day (BID) | INTRAVENOUS | Status: DC
Start: 2015-06-15 — End: 2015-06-21
  Administered 2015-06-15 – 2015-06-21 (×12): 600 mg via INTRAVENOUS
  Filled 2015-06-15 (×15): qty 300

## 2015-06-15 MED ORDER — MUPIROCIN 2 % EX OINT
1.0000 "application " | TOPICAL_OINTMENT | Freq: Two times a day (BID) | CUTANEOUS | Status: AC
  Administered 2015-06-15 – 2015-06-19 (×10): 1 via NASAL
  Filled 2015-06-15 (×2): qty 22

## 2015-06-15 MED ORDER — POTASSIUM CHLORIDE CRYS ER 20 MEQ PO TBCR
40.0000 meq | EXTENDED_RELEASE_TABLET | Freq: Once | ORAL | Status: AC
  Administered 2015-06-15: 40 meq via ORAL
  Filled 2015-06-15: qty 2

## 2015-06-15 NOTE — Progress Notes (Signed)
Patient ID: Misty Buckley, female   DOB: 17-Oct-1940, 75 y.o.   MRN: [id number]   Subjective: The day after Misty Buckley left the hospital, she felt terrible achy pain in her left lower extremity. The following morning, she felt acutely short of breath, even on her home 3L oxygen. She denies any fevers or chest pain. Today we brought up goals of care and she said she would like to talk more to her family.  Objective: Vital signs in last 24 hours: Filed Vitals:   06/15/15 0530 06/15/15 0545 06/15/15 0615 06/15/15 0800  BP:  97/49  90/39  Pulse:  47 95 101  Temp:    98.9 F (37.2 C)  TempSrc:    Oral  Resp:  28 25 25   Height:      Weight:      SpO2: 96% 95% 97% 95%   Weight change:   Intake/Output Summary (Last 24 hours) at 06/15/15 0716 Last data filed at 06/15/15 0500  Gross per 24 hour  Intake     50 ml  Output   3700 ml  Net  -3650 ml   General: obese ill-appearing lady resting in bed comfortably with high-flow oxygen mask, appropriately conversational Cardiac: irregular rate and rhythm, 2/6 systolic murmur loudest at the right upper sternal border Pulm: breathing well, some crackles in right lower lung base, left lower lung base tympanic to palpation with no lung sounds Abd: bowel sounds normal, soft, nondistended, non-tender Skin:  1. Both extremities have lower brawny indurated plaques consistent with chronic venous insufficiency 2. However, the left leg is markedly more edematous and tender with impressive overlying erythematous patches. There is a group of bullae over the anterior shin but no cutaneous anesthesia.  Lab Results: Basic Metabolic Panel:  Recent Labs Lab 06/14/15 1252 06/15/15 0527  NA 138 141  K 3.7 3.3*  CL 89* 87*  CO2 38* 42*  GLUCOSE 125* 106*  BUN 23* 19  CREATININE 1.04* 0.75  CALCIUM 8.6* 8.2*   CBC:  Recent Labs Lab 06/14/15 1252 06/15/15 0527  WBC 14.1* 13.5*  NEUTROABS 13.1*  --   HGB 12.4 11.9*  HCT 42.2 39.9  MCV 99.5 97.3    PLT 134* 121*   Studies/Results: Dg Chest Port 1 View  06/14/2015  CLINICAL DATA:  Shortness of breath, tachypnea EXAM: PORTABLE CHEST 1 VIEW COMPARISON:  03/28/2015 FINDINGS: Study is limited by poor inspiration and patient's large body habitus. Cardiomegaly again noted. Again noted significant elevation of the left hemidiaphragm. There is central vascular congestion and mild interstitial prominence bilaterally highly suspicious for pulmonary edema. Bilateral basilar atelectasis or infiltrate. IMPRESSION: Cardiomegaly again noted. Again noted significant elevation of the left hemidiaphragm. There is central vascular congestion and mild interstitial prominence bilaterally highly suspicious for pulmonary edema. Bilateral basilar atelectasis or infiltrate. Electronically Signed   By: Lahoma Crocker M.D.   On: 06/14/2015 13:45   Medications: I have reviewed the patient's current medications. Scheduled Meds: . antiseptic oral rinse  7 mL Mouth Rinse q12n4p  . ceFEPime (MAXIPIME) IV  2 g Intravenous Q12H  . chlorhexidine  15 mL Mouth Rinse BID  . DULoxetine  30 mg Oral QHS  . DULoxetine  60 mg Oral Daily  . potassium chloride  40 mEq Oral Once  . rivaroxaban  20 mg Oral Q supper  . simvastatin  20 mg Oral QHS  . sodium chloride flush  3 mL Intravenous Q12H  . vancomycin  1,500 mg Intravenous Q24H  Continuous Infusions:  PRN Meds:.ipratropium-albuterol   Assessment/Plan:  Misty Buckley ia 75 year old lady with heart failure with preserved ejection fraction and paroxysmal atrial fibrillation on rivaroxaban recently discharged for a mild gastrointerstinal bleed from internal hemorrhoids, at which time her rivaroxaban was held for 3 days, presenting with acute hypoxic respiratory failure.   Acute hypoxic respiratory failure: I wonder if she has a pulmonary embolus; her rivaroxaban was held for 3 days during her hemorrhoidal bleed, she has unilateral leg swelling, and her acute hypoxic respiratory failure  precipitated very quickly. She appears to be similarly fluid-overloaded compared to 3 days ago; her hypoxia doesn't appear to be fully explained by fluid overload but we'll see what her CT shows. -Stat PE CT -Lower extremity dopplers -Gave 500cc bolus before contrast load -Stopped rivaroxaban -Giving therapeutic enoxaparin 80m/kg twice daily -Initiated goals of care discussion  Left red leg: See picture above. I suspect this is a DVT right now given the overall clinical picture of a PE. The leg is more edematous that erythematous and she is very tender posteriomedially, but it's not classically-appearing. Cellulitis versus necrotizing fasciitis remains on the differential as well given the rapidity of onset. Vascultis is another consideration as there is a palpable purpuric component to the lesions. I've stopped antibiotics; if she doesn't have a DVT, I'll likely resume them. Will consider checking an ESR pending doppler study. -Dopplers pending -Stopped antibiotics -Will consider ESR/CRP pending doppler study  Paroxysmal atrial fibrillation: Electrocardiogram shows sinus tachycardia with numerous premature atrial complexes.   Dispo: Disposition is deferred at this time, awaiting improvement of current medical problems.  Anticipated discharge in approximately 3-5 day(s).   The patient does have a current PCP (Janifer Adie MD) and [facility].  The patient does have transportation limitations that hinder transportation to clinic appointments.  .Services Needed at time of discharge: Y = Yes, Blank = No PT:   OT:   RN:   Equipment:   Other:     LOS: 1 day   KLoleta Chance MD 06/15/2015, 7:16 AM

## 2015-06-15 NOTE — Consult Note (Signed)
WOC wound consult note Reason for Consult: Consult requested for bilat legs.  [facility]; refer to consult on 4/1.  Was previously wearing Una boots to BLE but developed cellulitis this week. Wound type: Left leg with cellulitis; generalized edema and erythremia from foot to upper thigh and extends to left outer hip. No open wounds or drainage at this time. Left outer hip with dry scab .2X.2cm, no open wound, odor or drainage. Measurement: Right leg with chronic full thickness wound; .5X.5X.1cm, red and moist, no odor or drainage. Dressing procedure/placement/frequency: Topical treatment will not be effective to treat cellulitis. Pt is on systemic coverage for antibiotics.  Foam dressing to protect and promote healing to RLE. Una boots are not appropriate at this time until cellulitis resolves. Please re-consult if further assistance is needed.  Thank-you,  Cammie Mcgeeawn Naftoli Penny MSN, RN, CWOCN, DarbyWCN-AP, CNS [phone]

## 2015-06-15 NOTE — Progress Notes (Signed)
Pharmacy Antibiotic Note  Misty Buckley is a 75 y.o. female admitted on 06/14/2015 with cellulitis.  Pharmacy has been consulted for zyvox/unasyn dosing.  Concern for left leg necrotizing fascitis/GAS. CTA negative for PE, vancomycin and cefepime stopped this morning.  Plan: Unasyn 3g q6 hours Zyvox 600mg  IV bid Consider holding cymbalta while on zyvox  Height: 5\' 5"  (165.1 cm) Weight: 253 lb 8.5 oz (115 kg) IBW/kg (Calculated) : 57  Temp (24hrs), Avg:98.8 F (37.1 C), Min:97.2 F (36.2 C), Max:100.2 F (37.9 C)   Recent Labs Lab 06/10/15 1830 06/11/15 0635 06/14/15 1252 06/14/15 1259 06/15/15 0527  WBC 11.2* 10.3 14.1*  --  13.5*  CREATININE 0.86 0.84 1.04*  --  0.75  LATICACIDVEN  --   --   --  1.77  --     Estimated Creatinine Clearance: 78.1 mL/min (by C-G formula based on Cr of 0.75).    Allergies  Allergen Reactions  . Hydromorphone Other (See Comments)    Decreased B/P & HR  . Morphine Other (See Comments)    Decreased B/P & HR  . Risperidone And Related Other (See Comments)    Mentally confused/ drooling  . Vancomycin Other (See Comments)    May have caused renal issues  . Zolpidem Other (See Comments)    Confusion,  Dizziness, and hallucinations    Antimicrobials this admission: Ancef 4/4 x 1 dose Cefepime 4/4>>4/5 vanc 4/4>>4/5   Microbiology results: 4/4 Ucx>> 4/4 BCx2>> MRSA +  Thank you for allowing pharmacy to be a part of this patient's care.  Sheppard CoilFrank Wilson PharmD., BCPS Clinical Pharmacist Pager [phone] 06/15/2015 2:29 PM

## 2015-06-15 NOTE — Progress Notes (Signed)
Initial Nutrition Assessment  DOCUMENTATION CODES:   Obesity unspecified  INTERVENTION:  -Possible need for NDD2/NDD3  -No other RD interventions warranted  NUTRITION DIAGNOSIS:   Inadequate oral intake related to inability to eat as evidenced by NPO status.  GOAL:   Patient will meet greater than or equal to 90% of their needs  MONITOR:   PO intake, Labs, I & O's, Skin  REASON FOR ASSESSMENT:   Low [place]    ASSESSMENT:   Patient is a 75 yo F with a PMHx of A-fib, OSA, CHF, HTN, morbid obesity presenting from her rehabilitation facility due to worsening shortness of breath. Patient states she has a long history of shortness of breath and is unable to state when her shortness of breath worsened. States she uses 3-4 L O2 via [place] at home. She denies having any chest pain or cough. Reports having swelling in her legs for 3 weeks and does admit to having symptoms of orthopnea  Spoke with Ms. Barry Dieneswens briefly at bedside.  Presents with Shortness of Breath. She denies any issues with appetite or PO intake r/t SOB. Endorses good appetite. Endorses wt loss. Weight stable per chart but patient has large swelling and erythema in LLE.  Per NFPE of upper body, no evidence of muscle wasting or fat depletion.  She does complain of issues swallowing previously. Pt indicated desire for "soft or chopped foods." Will place pt on NDD2 when diet is advanced. ?Speech Eval? Mentioned to patient's nurse.  Labs: K 3.3 Medications reviewed.   Diet Order:  Diet NPO time specified  Skin:  Wound (see comment) (Stg II to Buttocks)  Last BM:  4/1  Height:   Ht Readings from Last 1 Encounters:  06/14/15 5\' 5"  (1.651 m)    Weight:   Wt Readings from Last 1 Encounters:  06/15/15 253 lb 8.5 oz (115 kg)    Ideal Body Weight:  56.81 kg  BMI:  Body mass index is 42.19 kg/(m^2).  Estimated Nutritional Needs:   Kcal:  1800-2100 (25-30 kcal/kg ABW)  Protein:  115-140 grams  Fluid:  >/=  1.8L  EDUCATION NEEDS:   No education needs identified at this time  Dionne AnoWilliam M. Maisee Vollman, MS, RD LDN After Hours/Weekend Pager [phone]

## 2015-06-15 NOTE — Progress Notes (Signed)
VASCULAR LAB PRELIMINARY  PRELIMINARY  PRELIMINARY  PRELIMINARY  Left lower extremity venous duplex completed.     Left:  No evidence of DVT, superficial thrombosis, or Baker's cyst.  Other findings: Lymph node in left groin area. Gave result to Doctor.  Allizon Woznick, RVT, RDMS 06/15/2015, 1:21 PM

## 2015-06-15 NOTE — Discharge Summary (Signed)
Name: Misty Buckley MRN: [id number] DOB: 24-Oct-1940 75 y.o. PCP: Misty Bastosobert N Koehler, MD  Date of Admission: 06/14/2015 12:36 PM Date of Discharge: 06/21/2015  Attending Physician: Misty FeinsteinJames Buckley Granfortuna, MD  Discharge Diagnosis: 1. Left leg cellulitis 2. Acute hypoxic and hypercarbic respiratory failure from obesity hypoventilation syndrome, left hemidraphragmatic paresis, and flash pulmonary edema induced by atrial fibrillation  Discharge Medications:   Medication List    STOP taking these medications        alendronate 70 MG tablet  Commonly known as:  FOSAMAX     ALPRAZolam 0.5 MG tablet  Commonly known as:  XANAX     bag balm Oint ointment     BENEFIBER PO     fluocinonide cream 0.05 %  Commonly known as:  LIDEX     HYDROcodone-acetaminophen 5-325 MG tablet  Commonly known as:  NORCO/VICODIN     metolazone 2.5 MG tablet  Commonly known as:  ZAROXOLYN     potassium chloride SA 20 MEQ tablet  Commonly known as:  K-DUR,KLOR-CON     simvastatin 20 MG tablet  Commonly known as:  ZOCOR      TAKE these medications        CITRACAL PETITES/VITAMIN D 200-250 MG-UNIT Tabs  Generic drug:  Calcium Citrate-Vitamin D  Take 1 tablet by mouth daily.     diclofenac sodium 1 % Gel  Commonly known as:  VOLTAREN  Apply up to 4 times daily as needed for neck or back pain     doxycycline 100 MG tablet  Commonly known as:  VIBRA-TABS  Take 1 tablet (100 mg total) by mouth 2 (two) times daily for 1 week     DULoxetine 30 MG capsule  Commonly known as:  CYMBALTA  Take 30 mg by mouth at bedtime. Also take 60 mg tablet every morning     estradiol 0.1 MG/GM vaginal cream  Commonly known as:  ESTRACE  Place 1 Applicatorful vaginally at bedtime.     feeding supplement (ENSURE ENLIVE) Liqd  Take 237 mLs by mouth 2 (two) times daily between meals.     furosemide 40 MG tablet  Commonly known as:  LASIX  If weight increases to 255lbs, take 1 pill daily until weight drops to 250lbs       hydrocortisone 25 MG suppository  Commonly known as:  ANUSOL-HC  Place 1 suppository (25 mg total) rectally 2 (two) times daily.     LUBRIDERM Lotn  On bilateral lower extremities     Melatonin 5 MG Tabs  Take 5 mg by mouth at bedtime.     metoprolol succinate 50 MG 24 hr tablet  Commonly known as:  TOPROL-XL  Take 25 mg by mouth 2 (two) times daily. Take with or immediately following a meal.     multivitamin with minerals Tabs tablet  Take 1 tablet by mouth daily.     OXYGEN  Inhale 2 L into the lungs continuous.     rivaroxaban 20 MG Tabs tablet  Commonly known as:  XARELTO  Take 20 mg by mouth daily with supper.     senna-docusate 8.6-50 MG tablet  Commonly known as:  SENNA-S  Take 1 tablet by mouth at bedtime as needed for mild constipation.       Disposition and follow-up:   Ms.Misty Lucia GaskinsM Rathert [facility] Good [facility].  At the hospital follow up visit please address:  1. Keep her oxygen saturation between 88-95%. If  she goes over 95% she will become hypercarbic and somnolent.  2. Ensure she is using BiPap at night. Her BiPap settings are: IPAP 10 cmH20, EPAP 5cm H2O, FiO2 titrated to spO2 88-95% (between 20-40%), back-up rate 14.  3. Address her tenuous volume status; if her weight goes above 255lbs, start furosemide  daily until her weight returns below 250lbs  Procedures Performed:  X-ray Chest Pa And Lateral  06/15/2015  CLINICAL DATA:  Short of breath EXAM: CHEST  2 VIEW COMPARISON:  06/14/2015 FINDINGS: Left hemidiaphragm remains elevated. Left lower lobe airspace disease consistent with atelectasis unchanged. Mild right lower lobe airspace disease also unchanged. Mild vascular congestion without significant edema or effusion. IMPRESSION: Bibasilar atelectasis left greater than right is unchanged. Mild vascular congestion without edema. Electronically Signed   By: Marlan Palau Buckley.D.   On: 06/15/2015 07:47   Ct  Angio Chest Pe W/cm &/or Wo Cm  06/15/2015  CLINICAL DATA:  Extreme shortness of breath. EXAM: CT ANGIOGRAPHY CHEST WITH CONTRAST TECHNIQUE: Multidetector CT imaging of the chest was performed using the standard protocol during bolus administration of intravenous contrast. Multiplanar CT image reconstructions and MIPs were obtained to evaluate the vascular anatomy. CONTRAST:  100 cc Isovue 370 COMPARISON:  Chest x-ray 06/15/2015 and CT angiogram dated 10/08/2013 FINDINGS: Mediastinum/Lymph Nodes: No pulmonary emboli or thoracic aortic dissection identified. No masses or pathologically enlarged lymph nodes identified. Lungs/Pleura: There is increased elevation of the chronically elevated left hemidiaphragm with further compression of the left lung. There is slight atelectasis at the right lung base posteriorly and there are small patchy areas of mosaic pattern increased density in the right lung which can be seen with small airway disease. Upper abdomen: No acute findings. Musculoskeletal: No chest wall mass or suspicious bone lesions identified. Review of the MIP images confirms the above findings. IMPRESSION: 1. No acute pulmonary emboli. 2. Mosaic appearance in the right lung which can be seen with small airway or small vessel disease. This is new since the prior exam. 3. Increased chronic elevation of the left hemidiaphragm with secondary increased compression of the left lung. Electronically Signed   By: Francene Boyers Buckley.D.   On: 06/15/2015 12:04   Mr Tibia Fibula Left W Wo Contrast  06/15/2015  CLINICAL DATA:  Progressive cellulitis in the left lower leg. History of deep vein thrombosis. EXAM: MRI OF LOWER LEFT EXTREMITY WITHOUT AND WITH CONTRAST TECHNIQUE: Multiplanar, multisequence MR imaging of the left calf was performed both before and after administration of intravenous contrast. CONTRAST:  20mL MULTIHANCE GADOBENATE DIMEGLUMINE 529 MG/ML IV SOLN COMPARISON:  06/14/2015 FINDINGS: Degenerative spurring  in the medial and lateral compartments of the knee with loss of articular space. No findings of osteomyelitis involving the left tibia or fibula. There is asymmetric subcutaneous edema along the calf, especially confluent anterolaterally and in the mid to distal calf. In the distal calf approaching the ankle, the edema becomes more circumferential. The edema does appear confined to the superficial fascia all regions/subcutaneous tissues, without abnormal edema tracking within her along the muscular tissues or deep fascia planes of the calf to favor myositis or fasciitis. In addition, there is only very low-grade enhancement in the subcutaneous space common not entirely specific for cellulitis versus other causes of subcutaneous edema, although clearly from the clinical standpoint cellulitis is favored given the progression and clinical signs/symptoms. There does appear to be asymmetry of the cutaneous tissues, with thickening on the left. IMPRESSION: 1. Asymmetric subcutaneous edema in the left calf,  with only very faint associated enhancement, and with associated cutaneous thickening, suspicious for cellulitis given the clinical scenario. No abscess, osteomyelitis, myositis, or abnormal edema or enhancement along the deep fascia planes is identified to suggest myositis or fasciitis. No gas is observed in the soft tissues. Electronically Signed   By: Gaylyn Rong Buckley.D.   On: 06/15/2015 22:04   Dg Chest Port 1 View  06/14/2015  CLINICAL DATA:  Shortness of breath, tachypnea EXAM: PORTABLE CHEST 1 VIEW COMPARISON:  03/28/2015 FINDINGS: Study is limited by poor inspiration and patient's large body habitus. Cardiomegaly again noted. Again noted significant elevation of the left hemidiaphragm. There is central vascular congestion and mild interstitial prominence bilaterally highly suspicious for pulmonary edema. Bilateral basilar atelectasis or infiltrate. IMPRESSION: Cardiomegaly again noted. Again noted  significant elevation of the left hemidiaphragm. There is central vascular congestion and mild interstitial prominence bilaterally highly suspicious for pulmonary edema. Bilateral basilar atelectasis or infiltrate. Electronically Signed   By: Natasha Mead Buckley.D.   On: 06/14/2015 13:45   Admission HPI:  Patient is a 75 yo F with a PMHx of A-fib, OSA, CHF, HTN, morbid obesity presenting from her rehabilitation facility due to worsening shortness of breath. Patient states she has a long history of shortness of breath and is unable to state when her shortness of breath worsened. States she uses 3-4 L O2 via [place] at home. She denies having any chest pain or cough. Reports having swelling in her legs for 3 weeks and does admit to having symptoms of orthopnea. She also mentions noticing increased erythema on her left lower extremity yesterday and reports having pain in this extremity. Denies having any fevers, chills, nausea, or vomiting. States her abdomen is "sore" expecially on the right side, last bowel movement was yesterday. Denies having any melena or hematochezia. No further history could be obtained as patient had increased work of breathing.   On EMS arrival, the patient was severely dyspneic and placed on CPAP. She was given albuterol in transport. She had been tachycardic in the 130s during transport.   Patient was recently hospitalized 06/10/15 - 06/12/15 for a 1 month of progressively worsening hematochezia, dyschezia, and melena while on rivaroxaban for pn admission she was hemodynamically stable and her hemoglobin was noaroxysmal atrial fibrillation. Colonoscopy at the time showed non-bleeding internal hemorrhoids and non-bleeding diverticulosis.   Hospital Course by problem list:   1. Left leg cellulitis: She presented with rapidly progressive erythema with bullae and petechiae extending up her left thigh, concerning for cellulitis versus necrotizing fasciitis. Fortunately an MRI ruled out necrotizing  fasciitis and a DVT was ruled out with normal ultrasound. Blood cultures did not speciate any organisms. We treated this aggressively as purulent cellulitis with linezolid and ampicillin-sulbactam, and her cellulitis improved considerably over the course of 7 days. 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 She was discharged home on doxycycline 100mg  twice daily for an additional week (stop date 4/18).  2. Acute hypoxic and hypercarbic respiratory failure from obesity hypoventilation syndrome, left hemidiaphragmatic paresis, and flash pulmonary edema induced by atrial fibrillation: I believe cellulitis triggered her to go into atrial fibrillation which sparked flash pulmonary edema. A pulmonary embolus was ruled out with normal chest CTA.  She responded well to BiPap and briskly diuresed with IV furosemide on the first day of admission. Thereafter she appeared euvolemic without diuretics. Her dry weight appears to be 250lbs. She was discharged with BiPap for her obesity  hypoventilation syndrome, and discharged on no diuretics but instructed to take furosemide  daily if her weight goes above 255lbs. Her home metoprolol succinate  daily and rivaroxaban  daily were resumed upon discharge.  Discharge Vitals:   BP 102/65 mmHg  Pulse 73  Temp(Src) 97.9 F (36.6 C) (Oral)  Resp 18  Ht  (1.651 Buckley)  Wt 249 lb 12.5 oz (113.3 kg)  BMI 41.57 kg/m2  SpO2 96%  Discharge Labs:  Results for orders placed or performed during the hospital encounter of 06/14/15 (from the past 24 hour(s))  Basic metabolic panel     Status: Abnormal   Collection Time: 06/21/15  2:58 AM  Result Value Ref Range   Sodium 138 135 - 145 mmol/L   Potassium 2.9 (L) 3.5 - 5.1 mmol/L   Chloride 96 (L) 101 - 111 mmol/L   CO2 32 22 - 32 mmol/L   Glucose, Bld 98 65 - 99 mg/dL   BUN 15 6 - 20 mg/dL   Creatinine, Ser 1.61 0.44 - 1.00 mg/dL   Calcium 8.6 (L) 8.9 - 10.3 mg/dL    GFR calc non Af Amer >60 >60 mL/min   GFR calc Af Amer >60 >60 mL/min   Anion gap 10 5 - 15  Magnesium     Status: None   Collection Time: 06/21/15  2:58 AM  Result Value Ref Range   Magnesium 1.9 1.7 - 2.4 mg/dL  Basic metabolic panel     Status: Abnormal   Collection Time: 06/21/15 11:55 AM  Result Value Ref Range   Sodium 141 135 - 145 mmol/L   Potassium 3.5 3.5 - 5.1 mmol/L   Chloride 97 (L) 101 - 111 mmol/L   CO2 33 (H) 22 - 32 mmol/L   Glucose, Bld 113 (H) 65 - 99 mg/dL   BUN 16 6 - 20 mg/dL   Creatinine, Ser 0.96 0.44 - 1.00 mg/dL   Calcium 8.8 (L) 8.9 - 10.3 mg/dL   GFR calc non Af Amer >60 >60 mL/min   GFR calc Af Amer >60 >60 mL/min   Anion gap 11 5 - 15   Signed: Selina Cooley, MD 06/21/2015, 11:15 AM

## 2015-06-15 NOTE — Progress Notes (Signed)
Patient ID: Brown Humanthel M Wyles, female   DOB: 09-Mar-1941, 75 y.o.   MRN: [id number]  I spoke with [name] and her family this afternoon and gleaned some more information. Her daughter showed me pictures of her leg from 2 days prior; there was only mild erythema extending to the knee. She also told me she had vancomycin-induced renal failure back in 2012 when she was treated for cellulitis. 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 She had been on warfarin but was switched to rivaroxaban for an unknown reason.  On my exam today her erythema has progressed markedly up her thigh compared to the picture 2 days ago. She has petechiae on the dorsum of her foot and bullae on her anterior shin. Now that her CT PE study did not show a PE and her dopplers did not show a DVT, I am highly concerned of a rapidly progressive bullous cellulitis versus necrotizing fasciitis. At this point, I favor the former as she is not markedly systemically ill, but I feel we need to rule out necrotizing fasciitis with an MRI so I've ordered that now. I spoke to Dr. Daiva EvesVan Dam about antibiotic choices; he assisted greatly by recommending Linezolid and Ampicillin-sulbactam to cover MRSA, GAS, and anerobes, as well as providing an anti-toxin effect should this be GAS. If the MRI shows necrotizing infection, we will urgently consult Orthopedic surgery to evaluate her for debridement.  Selina CooleyKyle Bryley Chrisman MD

## 2015-06-15 NOTE — Evaluation (Signed)
Physical Therapy Evaluation Patient Details Name: Misty Buckley MRN: [id number] DOB: Jul 12, 1940 Today's Date: 06/15/2015   History of Present Illness  75 yo F with a PMHx of A-fib, OSA, CHF, HTN, morbid obesity presenting from her rehabilitation facility due to worsening shortness of breath  Clinical Impression  Patient demonstrates deficits in functional mobility as indicated below. WIll need continued skilled PT to address deficits and maximize function. Will see as indicated and progress as tolerated. Recommend return to SNF upon discharge.    Follow Up Recommendations SNF;Supervision/Assistance - 24 hour    Equipment Recommendations  None recommended by PT    Recommendations for Other Services       Precautions / Restrictions Precautions Precautions: Fall Precaution Comments: watch O2 Restrictions Weight Bearing Restrictions: No      Mobility  Bed Mobility Overal bed mobility: Needs Assistance Bed Mobility: Supine to Sit     Supine to sit: Min assist     General bed mobility comments: Min assist to come to EOB, use of chuck pad to assist with hip rotation. Patient able to elevate trunk with heavy reliance on bed rail  Transfers Overall transfer level: Needs assistance Equipment used: Rolling walker (2 wheeled) Transfers: Sit to/from Stand Sit to Stand: Min assist         General transfer comment: VCs for hand placement and positioning  Ambulation/Gait Ambulation/Gait assistance: Min guard Ambulation Distance (Feet): 8 Feet Assistive device: Rolling walker (2 wheeled) Gait Pattern/deviations: Step-to pattern;Decreased stride length;Trunk flexed Gait velocity: decreased Gait velocity interpretation: Below normal speed for age/gender General Gait Details: increased pain with mobility in LLE, able to tolerated ambulation to chair without physical assist. Cues for safety with use of RW and upright posture  Stairs            Wheelchair Mobility     Modified Rankin (Stroke Patients Only)       Balance     Sitting balance-Leahy Scale: Good     Standing balance support: Bilateral upper extremity supported Standing balance-Leahy Scale: Poor                               Pertinent Vitals/Pain Pain Assessment: Faces Faces Pain Scale: Hurts even more Pain Location: left leg, back and neck Pain Descriptors / Indicators: Aching;Discomfort;Guarding;Grimacing;Burning Pain Intervention(s): Monitored during session;Limited activity within patient's tolerance;Repositioned;Patient requesting pain meds-RN notified    Home Living Family/patient expects to be discharged to:: Skilled nursing facility Living Arrangements: Alone Available Help at Discharge: Family;Friend(s);Available PRN/intermittently   Home Access: Stairs to enter Entrance Stairs-Rails: None Entrance Stairs-Number of Steps: 1 ("Small threshold") Home Layout: One level Home Equipment: Walker - 4 wheels;Bedside commode;Hospital bed      Prior Function                 Hand Dominance        Extremity/Trunk Assessment               Lower Extremity Assessment: Generalized weakness (unable to assess due to pain; arythema and edema)         Communication      Cognition Arousal/Alertness: Awake/alert Behavior During Therapy: WFL for tasks assessed/performed Overall Cognitive Status: Within Functional Limits for tasks assessed                      General Comments General comments (skin integrity, edema, etc.): Saturations on Venti mask >95% with activity  and at rest. When patient removed mask breifly, saturations dropped to 87% very quickly.     Exercises        Assessment/Plan    PT Assessment Patient needs continued PT services  PT Diagnosis Difficulty walking;Abnormality of gait;Generalized weakness;Acute pain   PT Problem List Decreased strength;Decreased range of motion;Decreased activity tolerance;Decreased  balance;Decreased mobility;Decreased knowledge of use of DME;Cardiopulmonary status limiting activity;Obesity;Pain  PT Treatment Interventions DME instruction;Gait training;Functional mobility training;Therapeutic activities;Therapeutic exercise;Balance training;Neuromuscular re-education;Patient/family education;Cognitive remediation;Stair training   PT Goals (Current goals can be found in the Care Plan section) Acute Rehab PT Goals Patient Stated Goal: Go home as soon as possible PT Goal Formulation: With patient Time For Goal Achievement: 06/29/15 Potential to Achieve Goals: Fair    Frequency Min 3X/week   Barriers to discharge Decreased caregiver support      Co-evaluation               End of Session Equipment Utilized During Treatment: Gait belt;Oxygen Activity Tolerance: Patient limited by pain Patient left: in chair;with call bell/phone within reach;with chair alarm set Nurse Communication: Mobility status         Time: 1610-9604 PT Time Calculation (min) (ACUTE ONLY): 22 min   Charges:   PT Evaluation $PT Eval Moderate Complexity: 1 Procedure     PT G CodesFabio Asa 06/22/15, 11:03 AM  Charlotte Crumb, PT DPT  [phone]

## 2015-06-16 DIAGNOSIS — R0600 Dyspnea, unspecified: Secondary | ICD-10-CM | POA: Diagnosis present

## 2015-06-16 DIAGNOSIS — Z9981 Dependence on supplemental oxygen: Secondary | ICD-10-CM

## 2015-06-16 DIAGNOSIS — J9622 Acute and chronic respiratory failure with hypercapnia: Secondary | ICD-10-CM

## 2015-06-16 LAB — C-REACTIVE PROTEIN: CRP: 38.8 mg/dL — ABNORMAL HIGH (ref <1.0)

## 2015-06-16 LAB — BASIC METABOLIC PANEL
ANION GAP: 9 (ref 5–15)
BUN: 15 mg/dL (ref 6–20)
CO2: 45 mmol/L — AB (ref 22–32)
Calcium: 8.5 mg/dL — ABNORMAL LOW (ref 8.9–10.3)
Chloride: 87 mmol/L — ABNORMAL LOW (ref 101–111)
Creatinine, Ser: 0.63 mg/dL (ref 0.44–1.00)
GFR calc Af Amer: >60 mL/min (ref >60)
GFR calc non Af Amer: >60 mL/min (ref >60)
GLUCOSE: 94 mg/dL (ref 65–99)
POTASSIUM: 3.4 mmol/L — AB (ref 3.5–5.1)
Sodium: 141 mmol/L (ref 135–145)

## 2015-06-16 LAB — CBC
HEMATOCRIT: 39.9 % (ref 36.0–46.0)
HEMOGLOBIN: 11.5 g/dL — AB (ref 12.0–15.0)
MCH: 29 pg (ref 26.0–34.0)
MCHC: 28.8 g/dL — AB (ref 30.0–36.0)
MCV: 100.8 fL — ABNORMAL HIGH (ref 78.0–100.0)
Platelets: 167 10*3/uL (ref 150–400)
RBC: 3.96 MIL/uL (ref 3.87–5.11)
RDW: 14.3 % (ref 11.5–15.5)
WBC: 10.4 10*3/uL (ref 4.0–10.5)

## 2015-06-16 LAB — PROCALCITONIN: Procalcitonin: 0.52 ng/mL

## 2015-06-16 MED ORDER — POTASSIUM CHLORIDE CRYS ER 20 MEQ PO TBCR
40.0000 meq | EXTENDED_RELEASE_TABLET | Freq: Once | ORAL | Status: AC
  Administered 2015-06-16: 40 meq via ORAL
  Filled 2015-06-16: qty 2

## 2015-06-16 MED ORDER — FUROSEMIDE 80 MG PO TABS
80.0000 mg | ORAL_TABLET | Freq: Two times a day (BID) | ORAL | Status: DC
  Administered 2015-06-16 – 2015-06-17 (×2): 80 mg via ORAL
  Filled 2015-06-16 (×2): qty 1

## 2015-06-16 MED ORDER — METOPROLOL SUCCINATE ER 25 MG PO TB24: 25.0000 mg | ORAL_TABLET | Freq: Every day | ORAL | Status: DC

## 2015-06-16 MED ORDER — ACETAMINOPHEN 500 MG PO TABS
500.0000 mg | ORAL_TABLET | ORAL | Status: DC | PRN
  Administered 2015-06-17: 500 mg via ORAL
  Filled 2015-06-16: qty 1

## 2015-06-16 NOTE — Progress Notes (Signed)
Patient ID: Misty Buckley, female   DOB: 04-Apr-1940, 75 y.o.   MRN: [id number]   Subjective: Misty Buckley feels her breathing is back to baseline. Her left leg continues to hurt when she moves but her pain is tolerable. She has no other complaints today.  Objective: Vital signs in last 24 hours: Filed Vitals:   06/16/15 0800 06/16/15 1000 06/16/15 1200 06/16/15 1227  BP:   Pulse: 99 95 101   Temp:    97.4 F (36.3 C)  TempSrc:    Oral  Resp: Height:      Weight:      SpO2: 95% 92% 93%    Weight change: 4 lb 10.1 oz (2.101 kg)  Intake/Output Summary (Last 24 hours) at 06/16/15 1256 Last data filed at 06/16/15 1001  Gross per 24 hour  Intake    900 ml  Output    925 ml  Net    -25 ml     General: obese ill-appearing lady resting in bed comfortably with high-flow oxygen mask, appropriately conversational Cardiac: irregular rate and rhythm, 2/6 systolic murmur loudest at the right upper sternal border Pulm: breathing well, some crackles in right lower lung base, left lower lung base tympanic to palpation with no lung sounds Abd: bowel sounds normal, soft, nondistended, non-tender Skin: 1. Both extremities have lower brawny indurated plaques consistent with chronic venous insufficiency 2. However, the left leg is markedly more edematous and tender with impressive overlying erythematous patches. There is a group of bullae over the anterior shin but no cutaneous anesthesia. This appears to be slightly improved compared to yesterday       Lab Results: Basic Metabolic Panel:  Recent Labs Lab 06/15/15 0527 06/16/15 0418  NA 141 141  K 3.3* 3.4*  CL 87* 87*  CO2 42* 45*  GLUCOSE 106* 94  BUN 19 15  CREATININE 0.75 0.63  CALCIUM 8.2* 8.5*  MG 1.7  --    Studies/Results:  Ct Angio Chest Pe W/cm &/or Wo Cm  06/15/2015  IMPRESSION: 1. No acute pulmonary emboli. 2. Mosaic appearance in the right lung which can be seen with small airway or small  vessel disease. This is new since the prior exam. 3. Increased chronic elevation of the left hemidiaphragm with secondary increased compression of the left lung. Electronically Signed   By: Francene Boyers M.D.   On: 06/15/2015 12:04   Mr Tibia Fibula Left W Wo Contrast  06/15/2015  IMPRESSION: 1. Asymmetric subcutaneous edema in the left calf, with only very faint associated enhancement, and with associated cutaneous thickening, suspicious for cellulitis given the clinical scenario. No abscess, osteomyelitis, myositis, or abnormal edema or enhancement along the deep fascia planes is identified to suggest myositis or fasciitis. No gas is observed in the soft tissues. Electronically Signed   By: Gaylyn Rong M.D.   On: 06/15/2015 22:04   Medications: I have reviewed the patient's current medications. Scheduled Meds: . ampicillin-sulbactam (UNASYN) IV  3 g Intravenous Q6H  . antiseptic oral rinse  7 mL Mouth Rinse q12n4p  . chlorhexidine  15 mL Mouth Rinse BID  . Chlorhexidine Gluconate Cloth  6 each Topical Q0600  . enoxaparin (LOVENOX) injection  1 mg/kg Subcutaneous Q12H  . lidocaine  1 patch Transdermal Q24H  . linezolid (ZYVOX) IV  600 mg Intravenous Q12H  . mupirocin ointment  1 application Nasal BID  . simvastatin  20 mg Oral QHS  . sodium chloride  flush  3 mL Intravenous Q12H   Continuous Infusions:    PRN Meds:.acetaminophen, ipratropium-albuterol, ketorolac, ondansetron (ZOFRAN) IV   Assessment/Plan:  Misty Buckley ia 75 year old lady with heart failure with preserved ejection fraction and paroxysmal atrial fibrillation on rivaroxaban presenting with acute hypoxic and hypercarbic respiratory failure and left lower extremity cellulitis.  Left left cellulitis: We've ruled out necrotizing fasciitis and deep venous thrombosis with MRI and ultrasound, respectively. Her cellulitis appears to be clinically-improving with linezolid and ampicillin-sulbactam. We'll hold the course  today. -Continue linezolid and ampicillin-sulbactam -Transfer out of step-down today  Acute hypoxic and hypercarbic respiratory failure: Fortunately this has resolved. 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 We'll give her an incentive spirometer today and resume her metoprolol at a lower dose than she's on at home. My best guess is that her cellulitis triggered her atrial fibrillation and she went into flash pulmonary edema; she diuresed briskly and is now euvolemic on her home oxygen dose of 3-4L/min. Pulmonary embolus was ruled out with normal chest CTA. -Resume home diuretic regimen of oral furosemide 80mg  twice daily  -Resumed metoprolol XL 25mg  daily -Started incentive spirometer  Dispo: Disposition is deferred at this time, awaiting improvement of current medical problems.  Anticipated discharge in approximately 3-5 day(s).   The patient does have a current PCP Jethro Bastos(Robert N Koehler, MD) and [facility] after discharge.  The patient does have transportation limitations that hinder transportation to clinic appointments.  .Services Needed at time of discharge: Y = Yes, Blank = No PT:   OT:   RN:   Equipment:   Other:     LOS: 2 days   Selina CooleyKyle Jasani Lengel, MD 06/16/2015, 12:56 PM

## 2015-06-16 NOTE — Progress Notes (Signed)
Spoke with Dr Earnest ConroyFlores about pt not being able to transfer to telemetry unit due to use of BiPAP, pt has not been on CPAP or BiPAP at home prior to admit, pt must remain on stepdown unit while BiPAP in use (day or night use). (had spoke with Dr Yetta BarreJones earlier today of the concern she may not be able to transfer due to this).  Discussed continued use of foley cath due to pt getting urinary output measuring, strict I & O, SOBOE and skin problems in periarea/legs/thighs--pt cellulitis has affected her legs up to thighs and periarea, pt states she has incontinence normally. Pt continues on lasix. Plan to keep foley cath for now.  Also informed Dr Earnest ConroyFlores of marking off redden areas to left leg as Dr Yetta BarreJones had asked for today. Left leg redness covers most of leg, white area mostly to anterior upper thigh, (used BROWN marker for today's markings 06/16/15)  Pt already has purple marker markings to left lateral thigh.

## 2015-06-16 NOTE — NC FL2 (Signed)
[place] MEDICAID FL2 LEVEL OF CARE SCREENING TOOL     IDENTIFICATION  Patient Name: Misty Buckley Birthdate: July 03, 1940 Sex: female Admission Date (Current Location): 06/14/2015  [facility] [place]   Facility and Address:  [facility] [place], [address], [place], [place] [address]      Provider Number: [id number]  Attending Physician Name and Address:  Levert FeinsteinJames M Granfortuna, MD  Relative Name and Phone Number:  Casimiro NeedleMichael, son, [phone]    Current Level of Care: Hospital Recommended Level of Care: Skilled Nursing Facility Prior Approval Number:    Date Approved/Denied:   PASRR Number: [id number]  Discharge Plan: SNF    Current Diagnoses: Patient Active Problem List   Diagnosis Date Noted  . Left leg cellulitis   . CHF (congestive heart failure) (HCC) 06/14/2015  . Acute respiratory failure with hypoxia and hypercarbia (HCC) 06/14/2015  . Hemorrhoid   . Pressure ulcer 06/11/2015  . Rectal bleed 06/10/2015  . Rectal bleeding 06/10/2015  . Morbid obesity (HCC) 06/10/2015  . Atrial fibrillation (HCC) 06/10/2015  . Chronic venous insufficiency 06/10/2015  . Essential hypertension, benign 06/10/2015  . Sleep apnea 06/10/2015    Orientation RESPIRATION BLADDER Height & Weight     Self, Time, Situation, Place  O2 (4L) Continent, Indwelling catheter (Urinary catheter) Weight: 254 lb 10.1 oz (115.5 kg) Height:  5\' 5"  (165.1 cm)  BEHAVIORAL SYMPTOMS/MOOD NEUROLOGICAL BOWEL NUTRITION STATUS      Continent Diet (Cardiac)  AMBULATORY STATUS COMMUNICATION OF NEEDS Skin   Extensive Assist Verbally PU Stage and Appropriate Care (Stage II on buttocks and wound on leg)                       Personal Care Assistance Level of Assistance  Bathing, Feeding, Dressing Bathing Assistance: Maximum assistance Feeding assistance: Independent Dressing Assistance: Limited assistance     Functional Limitations Info              SPECIAL CARE FACTORS FREQUENCY  PT (By licensed PT)     PT Frequency: min 3x/week              Contractures      Additional Factors Info  Code Status, Allergies, Isolation Precautions Code Status Info: Full Allergies Info: Hydromorphone, Morphine, Risperidone And Related, Vancomycin, Zolpidem     Isolation Precautions Info: MRSA     Current Medications (06/16/2015):  This is the current hospital active medication list Current Facility-Administered Medications  Medication Dose Route Frequency Provider Last Rate Last Dose  . acetaminophen (TYLENOL) tablet 500 mg  500 mg Oral Q4H PRN Selina CooleyKyle Flores, MD   500 mg at 06/16/15 1047  . Ampicillin-Sulbactam (UNASYN) 3 g in sodium chloride 0.9 % 100 mL IVPB  3 g Intravenous Q6H Earnie LarssonFrank R Wilson, RPH   3 g at 06/16/15 1001  . antiseptic oral rinse (CPC / CETYLPYRIDINIUM CHLORIDE 0.05%) solution 7 mL  7 mL Mouth Rinse q12n4p Levert FeinsteinJames M Granfortuna, MD   7 mL at 06/15/15 1519  . chlorhexidine (PERIDEX) 0.12 % solution 15 mL  15 mL Mouth Rinse BID Levert FeinsteinJames M Granfortuna, MD   15 mL at 06/16/15 1001  . Chlorhexidine Gluconate Cloth 2 % PADS 6 each  6 each Topical Q0600 Levert FeinsteinJames M Granfortuna, MD   6 each at 06/16/15 0600  . enoxaparin (LOVENOX) injection 115 mg  1 mg/kg Subcutaneous Q12H Rushil Terrilee CroakPatel V, MD   115 mg at 06/16/15 1001  . furosemide (  LASIX) tablet 80 mg  80 mg Oral BID Selina Cooley, MD      . ipratropium-albuterol (DUONEB) 0.5-2.5 (3) MG/3ML nebulizer solution 3 mL  3 mL Nebulization Q4H PRN Courtney Paris, MD      . lidocaine (LIDODERM) 5 % 1 patch  1 patch Transdermal Q24H Darrick Huntsman, MD   Stopped at 06/15/15 2200  . linezolid (ZYVOX) IVPB 600 mg  600 mg Intravenous Q12H Earnie Larsson, RPH   600 mg at 06/16/15 1205  . metoprolol succinate (TOPROL-XL) 24 hr tablet 25 mg  25 mg Oral Daily Selina Cooley, MD      . mupirocin ointment (BACTROBAN) 2 % 1 application  1 application Nasal BID Levert Feinstein, MD   1 application at 06/16/15  1005  . ondansetron (ZOFRAN) injection 4 mg  4 mg Intravenous Q6H PRN Darrick Huntsman, MD      . simvastatin (ZOCOR) tablet 20 mg  20 mg Oral QHS Courtney Paris, MD   20 mg at 06/15/15 2120  . sodium chloride flush (NS) 0.9 % injection 3 mL  3 mL Intravenous Q12H Courtney Paris, MD   3 mL at 06/16/15 1610     Discharge Medications: Please see discharge summary for a list of discharge medications.  Relevant Imaging Results:  Relevant Lab Results:   Additional Information SSN: [id number]  Mearl Latin, LCSWA

## 2015-06-16 NOTE — Progress Notes (Signed)
PCCM PROGRESS NOTE  Subjective: She denies chest pain, wheeze, cough.  Vital signs: BP 83/55 mmHg  Pulse 96  Temp(Src) 97.4 F (36.3 C) (Oral)  Resp 25  Ht  (1.651 m)  Wt 254 lb 10.1 oz (115.5 kg)  BMI 42.37 kg/m2  SpO2 94%  Intake/output: I/O last 3 completed shifts: In: 976 [P.O.:320; I.V.:6; IV Piggyback:650] Out: 2475 [Urine:2475]  General: pleasant Neuro: alert, normal strength HEENT: no stridor Cardiac: regular Chest: decreased BS Lt base, no wheeze Abd: soft, non tender Ext: 1+ edema Skin: erythema Lt leg up to mid thigh   CMP Latest Ref Rng 06/16/2015 06/15/2015 06/14/2015  Glucose 65 - 99 mg/dL 94 409(W) 119(J)  BUN 6 - 20 mg/dL 15 19 47(W)  Creatinine 0.44 - 1.00 mg/dL 2.95 6.21 3.08(M)  Sodium 135 - 145 mmol/L 141 141 138  Potassium 3.5 - 5.1 mmol/L 3.4(L) 3.3(L) 3.7  Chloride 101 - 111 mmol/L 87(L) 87(L) 89(L)  CO2 22 - 32 mmol/L 45(H) 42(H) 38(H)  Calcium 8.9 - 10.3 mg/dL 5.7(Q) 4.6(N) 6.2(X)  Total Protein 6.5 - 8.1 g/dL - - 6.1(L)  Total Bilirubin 0.3 - 1.2 mg/dL - - 0.9  Alkaline Phos 38 - 126 U/L - - 84  AST 15 - 41 U/L - - 31  ALT 14 - 54 U/L - - 17    CBC Latest Ref Rng 06/16/2015 06/15/2015 06/14/2015  WBC 4.0 - 10.5 K/uL 10.4 13.5(H) 14.1(H)  Hemoglobin 12.0 - 15.0 g/dL 11.5(L) 11.9(L) 12.4  Hematocrit 36.0 - 46.0 % 39.9 39.9 42.2  Platelets 150 - 400 K/uL 167 121(L) 134(L)    ABG    Component Value Date/Time   PHART 7.414 06/14/2015 2105   PCO2ART 68.0* 06/14/2015 2105   PO2ART 160* 06/14/2015 2105   HCO3 42.7* 06/14/2015 2105   TCO2 44.8 06/14/2015 2105   O2SAT 99.2 06/14/2015 2105    X-ray Chest Pa And Lateral  06/15/2015  CLINICAL DATA:  Short of breath EXAM: CHEST  2 VIEW COMPARISON:  06/14/2015 FINDINGS: Left hemidiaphragm remains elevated. Left lower lobe airspace disease consistent with atelectasis unchanged. Mild right lower lobe airspace disease also unchanged. Mild vascular congestion without significant edema or effusion.  IMPRESSION: Bibasilar atelectasis left greater than right is unchanged. Mild vascular congestion without edema. Electronically Signed   By: Marlan Palau M.D.   On: 06/15/2015 07:47   Ct Angio Chest Pe W/cm &/or Wo Cm  06/15/2015  CLINICAL DATA:  Extreme shortness of breath. EXAM: CT ANGIOGRAPHY CHEST WITH CONTRAST TECHNIQUE: Multidetector CT imaging of the chest was performed using the standard protocol during bolus administration of intravenous contrast. Multiplanar CT image reconstructions and MIPs were obtained to evaluate the vascular anatomy. CONTRAST:  100 cc Isovue 370 COMPARISON:  Chest x-ray 06/15/2015 and CT angiogram dated 10/08/2013 FINDINGS: Mediastinum/Lymph Nodes: No pulmonary emboli or thoracic aortic dissection identified. No masses or pathologically enlarged lymph nodes identified. Lungs/Pleura: There is increased elevation of the chronically elevated left hemidiaphragm with further compression of the left lung. There is slight atelectasis at the right lung base posteriorly and there are small patchy areas of mosaic pattern increased density in the right lung which can be seen with small airway disease. Upper abdomen: No acute findings. Musculoskeletal: No chest wall mass or suspicious bone lesions identified. Review of the MIP images confirms the above findings. IMPRESSION: 1. No acute pulmonary emboli. 2. Mosaic appearance in the right lung which can be seen with small airway or small vessel disease. This is  new since the prior exam. 3. Increased chronic elevation of the left hemidiaphragm with secondary increased compression of the left lung. Electronically Signed   By: Francene BoyersJames  Maxwell M.D.   On: 06/15/2015 12:04   Mr Tibia Fibula Left W Wo Contrast  06/15/2015  CLINICAL DATA:  Progressive cellulitis in the left lower leg. History of deep vein thrombosis. EXAM: MRI OF LOWER LEFT EXTREMITY WITHOUT AND WITH CONTRAST TECHNIQUE: Multiplanar, multisequence MR imaging of the left calf was  performed both before and after administration of intravenous contrast. CONTRAST:  20mL MULTIHANCE GADOBENATE DIMEGLUMINE 529 MG/ML IV SOLN COMPARISON:  06/14/2015 FINDINGS: Degenerative spurring in the medial and lateral compartments of the knee with loss of articular space. No findings of osteomyelitis involving the left tibia or fibula. There is asymmetric subcutaneous edema along the calf, especially confluent anterolaterally and in the mid to distal calf. In the distal calf approaching the ankle, the edema becomes more circumferential. The edema does appear confined to the superficial fascia all regions/subcutaneous tissues, without abnormal edema tracking within her along the muscular tissues or deep fascia planes of the calf to favor myositis or fasciitis. In addition, there is only very low-grade enhancement in the subcutaneous space common not entirely specific for cellulitis versus other causes of subcutaneous edema, although clearly from the clinical standpoint cellulitis is favored given the progression and clinical signs/symptoms. There does appear to be asymmetry of the cutaneous tissues, with thickening on the left. IMPRESSION: 1. Asymmetric subcutaneous edema in the left calf, with only very faint associated enhancement, and with associated cutaneous thickening, suspicious for cellulitis given the clinical scenario. No abscess, osteomyelitis, myositis, or abnormal edema or enhancement along the deep fascia planes is identified to suggest myositis or fasciitis. No gas is observed in the soft tissues. Electronically Signed   By: Gaylyn RongWalter  Liebkemann M.D.   On: 06/15/2015 22:04   Dg Chest Port 1 View  06/14/2015  CLINICAL DATA:  Shortness of breath, tachypnea EXAM: PORTABLE CHEST 1 VIEW COMPARISON:  03/28/2015 FINDINGS: Study is limited by poor inspiration and patient's large body habitus. Cardiomegaly again noted. Again noted significant elevation of the left hemidiaphragm. There is central vascular  congestion and mild interstitial prominence bilaterally highly suspicious for pulmonary edema. Bilateral basilar atelectasis or infiltrate. IMPRESSION: Cardiomegaly again noted. Again noted significant elevation of the left hemidiaphragm. There is central vascular congestion and mild interstitial prominence bilaterally highly suspicious for pulmonary edema. Bilateral basilar atelectasis or infiltrate. Electronically Signed   By: Natasha MeadLiviu  Pop M.D.   On: 06/14/2015 13:45   Dg Tibia/fibula Left Port  06/14/2015  CLINICAL DATA:  Shortness of breath. Redness and swelling of the left leg. Initial encounter. EXAM: PORTABLE LEFT TIBIA AND FIBULA - 2 VIEW COMPARISON:  None. FINDINGS: Diffuse skin thickening and subcutaneous reticulation which is nonspecific between infection or lymphedema. No soft tissue gas or opaque foreign body. No signs of osseous infection or fracture. IMPRESSION: Diffuse soft tissue swelling without acute osseous finding. Electronically Signed   By: Marnee SpringJonathon  Watts M.D.   On: 06/14/2015 13:46   Dg Femur Port Min 2 Views Left  06/14/2015  CLINICAL DATA:  Redness over her left leg EXAM: LEFT FEMUR PORTABLE 2 VIEWS COMPARISON:  None. FINDINGS: No acute fracture. No dislocation. Osteopenia. Mild degenerative change in the knee joint. IMPRESSION: No acute bony pathology. Electronically Signed   By: Jolaine ClickArthur  Hoss M.D.   On: 06/14/2015 14:00    Studies:  PSG 05/17/15 >> AHI 2.9 CT chest 06/15/15 >> mosaic  appearance of Rt lung, elevated Lt hemidiaphragm Doppler legs 06/15/15 >> no DVT  Cultures: 4/05 Influenza PCR >> negative  Antibiotics: 4/04 Ancef >> 4/04 4/04 Cefepime >> 4/04  4/04 Vancomycin >> 4/04  4/05 Linezolid >> 4/05 Unasyn >>   Discussion: 75 yo female presented from rehab facility with progressive dyspnea, leg swelling and redness of Lt leg.  She has hx of HTN, A fib o xarelto, fibromyalgia.  Assessment/plan:  Acute on chronic hypoxic/hypercapnic respiratory failure 2nd to  obesity hypoventilation syndrome and chronic Lt hemidiaphragm elevation. - oxygen to keep SpO2 90 to 95% - BiPAP qhs and prn   Cellulitis Lt leg. - Abx per primary team   Coralyn Helling, MD [facility] Pulmonary/Critical Care 06/16/2015, 8:06 AM Pager:  [phone] After 3pm call: [phone]

## 2015-06-16 NOTE — Clinical Social Work Note (Signed)
Clinical Social Work Assessment  Patient Details  Name: Misty Buckley MRN: [id number] Date of Birth: 1940/10/17  Date of referral:  06/16/15               Reason for consult:  Facility Placement                Permission sought to share information with:  Facility Sport and exercise psychologist, Family Supports Permission granted to share information::  Yes, Verbal Permission Granted  Name::     Natale Lay::  [facility] SN[facility]  Relationship::  Son  Contact Information:  [phone]  Housing/Transportation Living arrangements for the past 2 months:  Single Family Home Source of Information:  Patient Patient Interpreter Needed:  None Criminal Activity/Legal Involvement Pertinent to Current Situation/Hospitalization:  No - Comment as needed Significant Relationships:  Adult Children Lives with:  Self Do you feel safe going back to the place where you live?  No Need for family participation in patient care:  Yes (Comment)  Care giving concerns:  CSW received referral for possible SNF placement at time of discharge. CSW met with patient regarding PT recommendation of SNF placement at time of discharge. Patient lives alone and is currently unable to care for herself at home given patient's current physical needs and fall risk. Patient expressed understanding of PT recommendation and is agreeable to SNF placement at time of discharge. CSW to continue to follow and assist with discharge planning needs.   Social Worker assessment / plan:  CSW spoke with patient concerning possibility of rehab at [facility] before returning home.  Employment status:  Retired Forensic scientist:  Other (Comment Required) ([facility]) PT Recommendations:  [facility] / Referral to community resources:  [employer]  Patient/Family's Response to care:  Patient recognizes need for rehab before returning home and is agreeable to a [facility] SNF. Patient reported preference for [facility].  Patient/Family's Understanding of and Emotional Response to Diagnosis, Current Treatment, and Prognosis:  Patient is realistic regarding therapy needs. No questions/concerns about plan or treatment.    Emotional Assessment Appearance:  Appears stated age Attitude/Demeanor/Rapport:  Other (Appropriate) Affect (typically observed):  Accepting, Appropriate, Quiet Orientation:  Oriented to Self, Oriented to Place, Oriented to  Time, Oriented to Situation Alcohol / Substance use:  Not Applicable Psych involvement (Current and /or in the community):  No (Comment)  Discharge Needs  Concerns to be addressed:  No discharge needs identified Readmission within the last 30 days:  No Current discharge risk:  None Barriers to Discharge:  Continued Medical Work up   [employer], [place] 06/16/2015, 4:49 PM

## 2015-06-16 NOTE — Progress Notes (Signed)
[facility] came by. sw w [facility] to work w [facility]. Pt had not been doing well at home. sw ref placed.

## 2015-06-16 NOTE — Progress Notes (Signed)
CRITICAL VALUE ALERT  Critical value received:  Blood cx grew gm + rods in aerobic container  Date of notification: 06/16/15  Time of notification:  2242  Critical value read back:Yes.    Nurse who received alert:  Cay SchillingsShelby Sunita Demond, RN  MD notified (1st page):  IM resident paged  Time of first page:  2325  MD notified (2nd page):  Time of second page:  Responding MD:  Kyung RuddKennedy, MD  Time MD responded:  2332  Patient already on unasyn which will cover the new bacteria. Will continue to monitor.

## 2015-06-16 NOTE — Clinical Social Work Placement (Signed)
   CLINICAL SOCIAL WORK PLACEMENT  NOTE  Date:  06/16/2015  Patient Details  Name: Misty Buckley MRN: [id number] Date of Birth: 1940/04/17  Clinical Social Work is seeking post-discharge placement for this patient at the Skilled  Nursing Facility level of care (*CSW will initial, date and re-position this form in  chart as items are completed):      Patient/family provided with [facility] Clinical Social Work Department's list of facilities offering this level of care within the geographic area requested by the patient (or if unable, by the patient's family).      Patient/family informed of their freedom to choose among providers that offer the needed level of care, that participate in Medicare, Medicaid or managed care program needed by the patient, have an available bed and are willing to accept the patient.      Patient/family informed of [facility]'s ownership interest in [facility] and [facility], as well as of the fact that they are under no obligation to receive care at these facilities.  PASRR submitted to EDS on       PASRR number received on       Existing PASRR number confirmed on 06/16/15     FL2 transmitted to all facilities in geographic area requested by pt/family on 06/16/15     FL2 transmitted to all facilities within larger geographic area on       Patient informed that his/her managed care company has contracts with or will negotiate with certain facilities, including the following:            Patient/family informed of bed offers received.  Patient chooses bed at       Physician recommends and patient chooses bed at      Patient to be transferred to   on  .  Patient to be transferred to facility by       Patient family notified on   of transfer.  Name of family member notified:        PHYSICIAN Please sign FL2     Additional Comment:    _______________________________________________ Mearl LatinNadia S Jessieca Rhem, LCSWA 06/16/2015, 5:00 PM

## 2015-06-17 DIAGNOSIS — J9611 Chronic respiratory failure with hypoxia: Secondary | ICD-10-CM

## 2015-06-17 DIAGNOSIS — E662 Morbid (severe) obesity with alveolar hypoventilation: Secondary | ICD-10-CM

## 2015-06-17 DIAGNOSIS — G4733 Obstructive sleep apnea (adult) (pediatric): Secondary | ICD-10-CM | POA: Diagnosis present

## 2015-06-17 DIAGNOSIS — J9612 Chronic respiratory failure with hypercapnia: Secondary | ICD-10-CM

## 2015-06-17 DIAGNOSIS — K648 Other hemorrhoids: Secondary | ICD-10-CM

## 2015-06-17 DIAGNOSIS — I480 Paroxysmal atrial fibrillation: Secondary | ICD-10-CM | POA: Diagnosis present

## 2015-06-17 DIAGNOSIS — I272 Other secondary pulmonary hypertension: Secondary | ICD-10-CM

## 2015-06-17 LAB — CBC
HEMATOCRIT: 40.1 % (ref 36.0–46.0)
HEMOGLOBIN: 11.8 g/dL — AB (ref 12.0–15.0)
MCH: 30 pg (ref 26.0–34.0)
MCHC: 29.4 g/dL — ABNORMAL LOW (ref 30.0–36.0)
MCV: 102 fL — ABNORMAL HIGH (ref 78.0–100.0)
Platelets: 155 10*3/uL (ref 150–400)
RBC: 3.93 MIL/uL (ref 3.87–5.11)
RDW: 14.3 % (ref 11.5–15.5)
WBC: 7.7 10*3/uL (ref 4.0–10.5)

## 2015-06-17 LAB — BASIC METABOLIC PANEL
ANION GAP: 10 (ref 5–15)
BUN: 14 mg/dL (ref 6–20)
CALCIUM: 8.2 mg/dL — AB (ref 8.9–10.3)
CHLORIDE: 88 mmol/L — AB (ref 101–111)
CO2: 46 mmol/L — ABNORMAL HIGH (ref 22–32)
CREATININE: 0.72 mg/dL (ref 0.44–1.00)
GFR calc non Af Amer: >60 mL/min (ref >60)
Glucose, Bld: 97 mg/dL (ref 65–99)
Potassium: 3.7 mmol/L (ref 3.5–5.1)
SODIUM: 144 mmol/L (ref 135–145)

## 2015-06-17 MED ORDER — FUROSEMIDE 40 MG PO TABS: 40.0000 mg | ORAL_TABLET | Freq: Two times a day (BID) | ORAL | Status: DC

## 2015-06-17 MED ORDER — METOPROLOL SUCCINATE ER 50 MG PO TB24: 50.0000 mg | ORAL_TABLET | Freq: Every day | ORAL | Status: DC

## 2015-06-17 MED ORDER — FUROSEMIDE 80 MG PO TABS
80.0000 mg | ORAL_TABLET | Freq: Two times a day (BID) | ORAL | Status: DC
  Administered 2015-06-17 – 2015-06-18 (×3): 80 mg via ORAL
  Filled 2015-06-17 (×3): qty 1

## 2015-06-17 MED ORDER — RIVAROXABAN 20 MG PO TABS
20.0000 mg | ORAL_TABLET | Freq: Every day | ORAL | Status: DC
  Administered 2015-06-18 – 2015-06-20 (×3): 20 mg via ORAL
  Filled 2015-06-17 (×3): qty 1

## 2015-06-17 NOTE — Discharge Instructions (Signed)

## 2015-06-17 NOTE — Progress Notes (Signed)
Physical Therapy Treatment Patient Details Name: Misty Buckley MRN: [id number] DOB: [birth year] Today's Date: 06/17/2015    History of Present Illness 75 yo F with a PMHx of A-fib, OSA, CHF, HTN, morbid obesity presenting from her rehabilitation facility due to worsening shortness of breath    PT Comments    Patient seen for mobility, ambulated in room but limited by need for BM. Assisted patient with hygiene and pericare. Patient educated on pursed lip breathing. OF NOTE: patient with desaturations upon entering room into 70s (patient did not have her [place] in her nose upon arrival, educated on importance on compliance).  Follow Up Recommendations  SNF;Supervision/Assistance - 24 hour     Equipment Recommendations  None recommended by PT    Recommendations for Other Services       Precautions / Restrictions Precautions Precautions: Fall Precaution Comments: watch O2 Restrictions Weight Bearing Restrictions: No    Mobility  Bed Mobility Overal bed mobility: Needs Assistance Bed Mobility: Supine to Sit     Supine to sit: Min assist     General bed mobility comments: min assist via UE support to pull to sitting, patient then able to scoot to EOB min guard  Transfers Overall transfer level: Needs assistance Equipment used: Rolling walker (2 wheeled) Transfers: Sit to/from Stand Sit to Stand: Min assist         General transfer comment: performed from bed and commode, min assist from commode, min guard from bed. VCs for hand placement  Ambulation/Gait Ambulation/Gait assistance: Min guard Ambulation Distance (Feet): 10 Feet Assistive device: Rolling walker (2 wheeled) Gait Pattern/deviations: Step-to pattern;Decreased stride length;Trunk flexed Gait velocity: decreased   General Gait Details: ambulation distance limited secondary to patient having to have a BM, assisted patient to commode then back to chair   Stairs            Wheelchair Mobility     Modified Rankin (Stroke Patients Only)       Balance     Sitting balance-Leahy Scale: Good       Standing balance-Leahy Scale: Poor                      Cognition Arousal/Alertness: Awake/alert Behavior During Therapy: WFL for tasks assessed/performed Overall Cognitive Status: Within Functional Limits for tasks assessed                      Exercises      General Comments        Pertinent Vitals/Pain Pain Assessment: Faces Faces Pain Scale: Hurts little more Pain Location: back and left leg Pain Descriptors / Indicators: Aching Pain Intervention(s): Monitored during session;Limited activity within patient's tolerance;Repositioned;Patient requesting pain meds-RN notified    Home Living                      Prior Function            PT Goals (current goals can now be found in the care plan section) Acute Rehab PT Goals Patient Stated Goal: Go home as soon as possible PT Goal Formulation: With patient Time For Goal Achievement: 06/29/15 Potential to Achieve Goals: Fair Progress towards PT goals: Progressing toward goals    Frequency  Min 3X/week    PT Plan Current plan remains appropriate    Co-evaluation             End of Session Equipment Utilized During Treatment: Gait belt;Oxygen Activity Tolerance: Patient limited  by pain Patient left: in chair;with call bell/phone within reach;with chair alarm set     Time: [phone] PT Time Calculation (min) (ACUTE ONLY): 19 min  Charges:  $Therapeutic Activity: 8-22 mins                    G CodesFabio Buckley:      Misty Buckley 06/17/2015, 11:27 AM Misty Buckley, PT DPT  [phone]

## 2015-06-17 NOTE — Progress Notes (Signed)
Patient ID: Brown Humanthel M Fichera, female   DOB: January 25, 1941, 75 y.o.   MRN: [id number] Medicine attending: We appreciate pulmonary medicine input. We will follow recommendations. BiPAP at bedtime and when necessary. Keep oxygen saturations in the 90-95% range and not higher so as not to blackout her respiratory drive. I examined this patient this morning together with resident physician Dr. Selina CooleyKyle Flores and I tested the accuracy of his evaluation and management plan which we discussed together. She is clinically stable. Oxygenating well. Extensive cellulitis left lower extremity stable to improved. We will continue a full 7-10 day course of parenteral Unasyn plus Zyvox. Impression: #1. Extensive left lower extremity cellulitis. Initial MRSA screen negative on April 2 but positive when repeated on April 4. Likely colonization from being in the hospital. We will continue current antibiotics. #2. Obesity obstructive sleep apnea On chronic home oxygen. #3. Venous insufficiency with chronic 3+ lower extremity edema #4. Poorly documented pulmonary hypertension and diastolic heart failure with normal echocardiogram of both right and left ventricular function in January 2017. No clinical signs of acute heart failure at this time. #5. Paroxysmal atrial fibrillation. She is on therapeutic dose subcutaneous low molecular weight heparin while in the hospital. Resume Xarelto at discharge. Normal renal function. #6. Recent self-limited rectal bleed felt secondary to internal hemorrhoids. Colonoscopy otherwise negative.

## 2015-06-17 NOTE — Progress Notes (Addendum)
Patient ID: Misty Buckley, female   DOB: 08-04-1940, 75 y.o.   MRN: [id number]   Subjective: Misty Buckley was in high spirits this morning. She told us her breathing and leg pain are improving. She doesn't have any complaints this morning.  Objective: Vital signs in last 24 hours: Filed Vitals:   06/17/15 0400 06/17/15 0500 06/17/15 0600 06/17/15 0844  BP: 92/59  94/52 99/60  Pulse: 90  89 101  Temp:    97.4 F (36.3 C)  TempSrc:    Oral  Resp: 19  21 20   Height:      Weight:  256 lb 6.3 oz (116.3 kg)    SpO2: 100%  92% 100%   Weight change: 1 lb 12.2 oz (0.8 kg)  Intake/Output Summary (Last 24 hours) at 06/17/15 1039 Last data filed at 06/17/15 0500  Gross per 24 hour  Intake    900 ml  Output   2200 ml  Net  -1300 ml     General: obese ill-appearing lady resting in bed comfortably with BiPap mask, appropriately conversational Cardiac: regular rate and rhythm with ectopic beats, without murmurs Pulm: breathing well, some crackles in right lower lung base, left lower lung base tympanic to palpation with no lung sounds Abd: bowel sounds normal, soft, nondistended, non-tender Skin: 1. Both extremities have lower brawny indurated plaques consistent with chronic venous insufficiency 2. However, the left leg is markedly more edematous and tender with impressive overlying erythematous patches. There is a group of bullae over the anterior shin but no cutaneous anesthesia. This appears to be slightly improved compared to yesterday when looking at the outline         Lab Results: Basic Metabolic Panel:  Recent Labs Lab 06/15/15 0527 06/16/15 0418 06/17/15 0207  NA 141 141 144  K 3.3* 3.4* 3.7  CL 87* 87* 88*  CO2 42* 45* 46*  GLUCOSE 106* 94 97  BUN 19 15 14   CREATININE 0.75 0.63 0.72  CALCIUM 8.2* 8.5* 8.2*  MG 1.7  --   --    CBC:  Recent Labs Lab 06/14/15 1252  06/16/15 0418 06/17/15 0207  WBC 14.1*  < > 10.4 7.7  NEUTROABS 13.1*  --   --   --   HGB 12.4  < >  11.5* 11.8*  HCT 42.2  < > 39.9 40.1  MCV 99.5  < > 100.8* 102.0*  PLT 134*  < > 167 155  < > = values in this interval not displayed.   Micro Results: 1 out of 2 blood cultures from 4/2 are growing gram-positive rods  Medications: I have reviewed the patient's current medications. Scheduled Meds: . ampicillin-sulbactam (UNASYN) IV  3 g Intravenous Q6H  . antiseptic oral rinse  7 mL Mouth Rinse q12n4p  . chlorhexidine  15 mL Mouth Rinse BID  . Chlorhexidine Gluconate Cloth  6 each Topical Q0600  . enoxaparin (LOVENOX) injection  1 mg/kg Subcutaneous Q12H  . furosemide  80 mg Oral BID  . lidocaine  1 patch Transdermal Q24H  . linezolid (ZYVOX) IV  600 mg Intravenous Q12H  . metoprolol succinate  25 mg Oral Daily  . mupirocin ointment  1 application Nasal BID  . simvastatin  20 mg Oral QHS  . sodium chloride flush  3 mL Intravenous Q12H   Continuous Infusions:  PRN Meds:.acetaminophen, ipratropium-albuterol, ondansetron (ZOFRAN) IV   Assessment/Plan:  Misty Buckley ia 75 year old lady with heart failure with preserved ejection fraction, obesity hypoventilation syndrome,  and paroxysmal atrial fibrillation on rivaroxaban presenting with acute hypoxic and hypercarbic respiratory failure and left lower extremity cellulitis.  Left leg cellulitis: We've ruled out necrotizing fasciitis with a normal MRI. Fortunately her cellulitis appears to be improving on paraenteral antibiotics. We'll continue them for a 7-10 day course, depending on how she does. One out of two blood cultures grew gram positive rods; I suspect this is likely skin-contaminant. -Continue linezolid and ampicillin-sulbactam -Thank you nursing for outlining the erythema  Acute hypoxic and hypercarbic respiratory failure: Fortunately this has resolved. I think she has underlying obesity hypoventilation syndrome and has some atelectasis going on as well. She appears euvolemic so we'll continue her home diuretic regimen. We'll  continue BiPap at night along with an incentive spirometer.  -Continue BiPap at night -Continue furosemide  twice daily -Holding metoprolol XL  daily -Continue incentive spirometer  Paroxysmal atrial fibrillation: She is in normal sinus rhythm now. We've ruled out thromboembolism and her renal function is normal so we'll get her back on rivaroxaban tomorrow. Will work on better controlling her rate with metoprolol per above. -Changed enoxaparin to rivaroxaban  daily -(last dose enoxaparin tonight; first dose of rivaroxaban tomorrow morning)  Dispo: Disposition is deferred at this time, awaiting improvement of current medical problems.  Anticipated discharge in approximately 2-5 day(s).   The patient does have a current PCP Jethro Bastos, MD) and does need an [facility] hospital follow-up appointment after discharge.  The patient does have transportation limitations that hinder transportation to clinic appointments.  .Services Needed at time of discharge: Y = Yes, Blank = No PT:   OT:   RN:   Equipment:   Other:     LOS: 3 days   Selina Cooley, MD 06/17/2015, 10:39 AM  ADDENDUM:  Pressures looking a little soft so we'll hold her metoprolol for now.  I've also swapped enoxaparin back to her home rivaroxaban as we've ruled out thromboembolism and her renal function is normal.  Selina Cooley MD

## 2015-06-17 NOTE — Progress Notes (Signed)
Noted new order for toprol XL increased to 50mg , notified Dr Selina CooleyKyle Flores that pt has been running a low BP in 90's, pt did not receive toprol yesterday due to low BP and not given this am due to low BP.  He instructed to continue to hold med.

## 2015-06-17 NOTE — Progress Notes (Signed)
PCCM PROGRESS NOTE  Admission date: 06/14/2015  Chief complaint: Short of breath  Subjective: Slept well with BiPAP overnight.  Vital signs: BP 101/58 mmHg  Pulse 101  Temp(Src) 97.4 F (36.3 C) (Oral)  Resp 20  Ht 5\' 5"  (1.651 m)  Wt 256 lb 6.3 oz (116.3 kg)  BMI 42.67 kg/m2  SpO2 100%  Intake/output: I/O last 3 completed shifts: In: 1500 [IV Piggyback:1500] Out: 2800 [Urine:2800]  General: pleasant, sitting in chair Neuro: alert, normal strength HEENT: no stridor Cardiac: regular Chest: decreased BS Lt base, no wheeze Abd: soft, non tender Ext: 1+ edema Skin: decreased erythema Lt leg   CMP Latest Ref Rng 06/17/2015 06/16/2015 06/15/2015  Glucose 65 - 99 mg/dL 97 94 960(A106(H)  BUN 6 - 20 mg/dL 14 15 19   Creatinine 0.44 - 1.00 mg/dL 5.400.72 9.810.63 1.910.75  Sodium 135 - 145 mmol/L 144 141 141  Potassium 3.5 - 5.1 mmol/L 3.7 3.4(L) 3.3(L)  Chloride 101 - 111 mmol/L 88(L) 87(L) 87(L)  CO2 22 - 32 mmol/L 46(H) 45(H) 42(H)  Calcium 8.9 - 10.3 mg/dL 8.2(L) 8.5(L) 8.2(L)  Total Protein 6.5 - 8.1 g/dL - - -  Total Bilirubin 0.3 - 1.2 mg/dL - - -  Alkaline Phos 38 - 126 U/L - - -  AST 15 - 41 U/L - - -  ALT 14 - 54 U/L - - -    CBC Latest Ref Rng 06/17/2015 06/16/2015 06/15/2015  WBC 4.0 - 10.5 K/uL 7.7 10.4 13.5(H)  Hemoglobin 12.0 - 15.0 g/dL 11.8(L) 11.5(L) 11.9(L)  Hematocrit 36.0 - 46.0 % 40.1 39.9 39.9  Platelets 150 - 400 K/uL 155 167 121(L)    ABG    Component Value Date/Time   PHART 7.414 06/14/2015 2105   PCO2ART 68.0* 06/14/2015 2105   PO2ART 160* 06/14/2015 2105   HCO3 42.7* 06/14/2015 2105   TCO2 44.8 06/14/2015 2105   O2SAT 99.2 06/14/2015 2105    Ct Angio Chest Pe W/cm &/or Wo Cm  06/15/2015  CLINICAL DATA:  Extreme shortness of breath. EXAM: CT ANGIOGRAPHY CHEST WITH CONTRAST TECHNIQUE: Multidetector CT imaging of the chest was performed using the standard protocol during bolus administration of intravenous contrast. Multiplanar CT image reconstructions and MIPs  were obtained to evaluate the vascular anatomy. CONTRAST:  100 cc Isovue 370 COMPARISON:  Chest x-ray 06/15/2015 and CT angiogram dated 10/08/2013 FINDINGS: Mediastinum/Lymph Nodes: No pulmonary emboli or thoracic aortic dissection identified. No masses or pathologically enlarged lymph nodes identified. Lungs/Pleura: There is increased elevation of the chronically elevated left hemidiaphragm with further compression of the left lung. There is slight atelectasis at the right lung base posteriorly and there are small patchy areas of mosaic pattern increased density in the right lung which can be seen with small airway disease. Upper abdomen: No acute findings. Musculoskeletal: No chest wall mass or suspicious bone lesions identified. Review of the MIP images confirms the above findings. IMPRESSION: 1. No acute pulmonary emboli. 2. Mosaic appearance in the right lung which can be seen with small airway or small vessel disease. This is new since the prior exam. 3. Increased chronic elevation of the left hemidiaphragm with secondary increased compression of the left lung. Electronically Signed   By: Francene BoyersJames  Maxwell M.D.   On: 06/15/2015 12:04   Mr Tibia Fibula Left W Wo Contrast  06/15/2015  CLINICAL DATA:  Progressive cellulitis in the left lower leg. History of deep vein thrombosis. EXAM: MRI OF LOWER LEFT EXTREMITY WITHOUT AND WITH CONTRAST TECHNIQUE: Multiplanar, multisequence  MR imaging of the left calf was performed both before and after administration of intravenous contrast. CONTRAST:  20mL MULTIHANCE GADOBENATE DIMEGLUMINE 529 MG/ML IV SOLN COMPARISON:  06/14/2015 FINDINGS: Degenerative spurring in the medial and lateral compartments of the knee with loss of articular space. No findings of osteomyelitis involving the left tibia or fibula. There is asymmetric subcutaneous edema along the calf, especially confluent anterolaterally and in the mid to distal calf. In the distal calf approaching the ankle, the edema  becomes more circumferential. The edema does appear confined to the superficial fascia all regions/subcutaneous tissues, without abnormal edema tracking within her along the muscular tissues or deep fascia planes of the calf to favor myositis or fasciitis. In addition, there is only very low-grade enhancement in the subcutaneous space common not entirely specific for cellulitis versus other causes of subcutaneous edema, although clearly from the clinical standpoint cellulitis is favored given the progression and clinical signs/symptoms. There does appear to be asymmetry of the cutaneous tissues, with thickening on the left. IMPRESSION: 1. Asymmetric subcutaneous edema in the left calf, with only very faint associated enhancement, and with associated cutaneous thickening, suspicious for cellulitis given the clinical scenario. No abscess, osteomyelitis, myositis, or abnormal edema or enhancement along the deep fascia planes is identified to suggest myositis or fasciitis. No gas is observed in the soft tissues. Electronically Signed   By: Gaylyn Rong M.D.   On: 06/15/2015 22:04    Studies:  PSG 05/17/15 >> AHI 2.9 CT chest 06/15/15 >> mosaic appearance of Rt lung, elevated Lt hemidiaphragm Doppler legs 06/15/15 >> no DVT  Cultures: 4/05 Influenza PCR >> negative  Antibiotics: 4/04 Ancef >> 4/04 4/04 Cefepime >> 4/04  4/04 Vancomycin >> 4/04  4/05 Linezolid >> 4/05 Unasyn >>   Discussion: 75 yo female presented from rehab facility with progressive dyspnea, leg swelling and redness of Lt leg.  She has hx of HTN, A fib o xarelto, fibromyalgia.  Assessment/plan:  Acute on chronic hypoxic/hypercapnic respiratory failure 2nd to obesity hypoventilation syndrome and chronic Lt hemidiaphragm elevation. - oxygen to keep SpO2 90 to 95% - BiPAP qhs and prn >> using BiPAP 10/5 cm H2O >> she will need BiPAP ST as outpt with back up rate of 14  Cellulitis Lt leg. - Abx per primary team  Updated  pt's sister at bedside.  PCCM will f/u on Monday 06/20/15 >> Please call if help needed sooner.   Coralyn Helling, MD [facility] Pulmonary/Critical Care 06/17/2015, 11:05 AM Pager:  [phone] After 3pm call: [phone]

## 2015-06-18 LAB — BASIC METABOLIC PANEL
ANION GAP: 11 (ref 5–15)
BUN: 8 mg/dL (ref 6–20)
CHLORIDE: 83 mmol/L — AB (ref 101–111)
CO2: 50 mmol/L — ABNORMAL HIGH (ref 22–32)
Calcium: 8.2 mg/dL — ABNORMAL LOW (ref 8.9–10.3)
Creatinine, Ser: 0.62 mg/dL (ref 0.44–1.00)
GFR calc Af Amer: >60 mL/min (ref >60)
Glucose, Bld: 99 mg/dL (ref 65–99)
POTASSIUM: 3 mmol/L — AB (ref 3.5–5.1)
SODIUM: 144 mmol/L (ref 135–145)

## 2015-06-18 LAB — CBC
HEMATOCRIT: 40.1 % (ref 36.0–46.0)
HEMOGLOBIN: 11.3 g/dL — AB (ref 12.0–15.0)
MCH: 28.5 pg (ref 26.0–34.0)
MCHC: 28.2 g/dL — ABNORMAL LOW (ref 30.0–36.0)
MCV: 101 fL — ABNORMAL HIGH (ref 78.0–100.0)
Platelets: 194 10*3/uL (ref 150–400)
RBC: 3.97 MIL/uL (ref 3.87–5.11)
RDW: 14.3 % (ref 11.5–15.5)
WBC: 6.7 10*3/uL (ref 4.0–10.5)

## 2015-06-18 LAB — MAGNESIUM: Magnesium: 1.6 mg/dL — ABNORMAL LOW (ref 1.7–2.4)

## 2015-06-18 MED ORDER — MAGNESIUM SULFATE 4 GM/100ML IV SOLN
4.0000 g | Freq: Once | INTRAVENOUS | Status: AC
Start: 2015-06-18 — End: 2015-06-18
  Administered 2015-06-18: 4 g via INTRAVENOUS
  Filled 2015-06-18: qty 100

## 2015-06-18 MED ORDER — POTASSIUM CHLORIDE CRYS ER 20 MEQ PO TBCR: 40.0000 meq | EXTENDED_RELEASE_TABLET | Freq: Two times a day (BID) | ORAL | Status: DC

## 2015-06-18 MED ORDER — METOPROLOL SUCCINATE ER 50 MG PO TB24
50.0000 mg | ORAL_TABLET | Freq: Every day | ORAL | Status: DC
  Administered 2015-06-18: 50 mg via ORAL
  Filled 2015-06-18: qty 1

## 2015-06-18 MED ORDER — POTASSIUM CHLORIDE CRYS ER 20 MEQ PO TBCR
40.0000 meq | EXTENDED_RELEASE_TABLET | Freq: Two times a day (BID) | ORAL | Status: AC
  Administered 2015-06-18 (×2): 40 meq via ORAL
  Filled 2015-06-18 (×3): qty 2

## 2015-06-18 MED ORDER — METOPROLOL SUCCINATE ER 25 MG PO TB24: 25.0000 mg | ORAL_TABLET | Freq: Two times a day (BID) | ORAL | Status: DC

## 2015-06-18 NOTE — Progress Notes (Addendum)
  Reason for call:   I placed an outgoing call to [name], sister of [name], around 3:30 PM regarding an update per the patient's request .   Assessment/ Plan:   [name] expressed concerns about her sister's ability to care for herself at home. She receives assistance from a certified nursing assistant 3 times a week and travels to [facility] twice a [place]. Traveling to and from [facility] wears her sister out given her functional reserve. She has no help at home during the weekend.  She currently lives in an independent living facility and sleeps in a recliner chair at night due to her respiratory disease. She could not sleep in a bed. At one point, she was using an [employer] (?) breathing machine which she felt improved her sister's ability to ambulate though could no longer have this machine due to insurance coverage.  She is in agreement with the patient in pursuing placement in a facility that can provide more skilled nursing. In the past, she has been at [facility] and had a good experience there.  In reviewing her sister's hospital course with her, she is reassured to hear that the cellulitis is improving. She also acknowledges the severity of her pulmonary disease and is in agreement with me in that resuscitation measures, like CPR or mechanical ventilation, would not improve her quality of life. She acknowledges that the patient has had prior experiences with end-of-life care with other family members, including her mother, and feels this would be concordant with the patient's wishes and will speak to her further to make sure these wishes are formally documented.   With regards to advanced directives, she has not been healthcare power of attorney for 2 years now though was so previously. She knows that the patient had to complete some kind of paperwork prior to enrolling the [facility] program last year that was unaware of what was documented.  She provided me the contact information for  the social worker at [facility] [employer] [facility] [[email or web address]] with whom we will attempt to contact to facilitate documentation of the patient's wishes.  She thanks me for my time and will attempt to go see the patient sometime later today.    Beather Arbourushil Patel V, MD   06/18/2015, 4:41 PM

## 2015-06-18 NOTE — Progress Notes (Signed)
Patient ID: Misty Buckley, female   DOB: 03/24/1940, 75 y.o.   MRN: [id number]   Subjective: She reported that she had "a rough time" with her leg last night though feels it is improving overall. She has not had any additional difficulties with her breathing. She would like for me to speak with her sister Misty Buckley though could not recall the phone number.  Objective: Vital signs in last 24 hours: Filed Vitals:   06/18/15 0900 06/18/15 1000 06/18/15 1200 06/18/15 1203  BP:  115/64 111/81 111/81  Pulse: 115 104 92 130  Temp:    97.5 F (36.4 C)  TempSrc:    Oral  Resp: Height:      Weight:      SpO2: 95% 94% 82% 90%   Weight change: 2 lb 6.8 oz (1.1 kg)  Intake/Output Summary (Last 24 hours) at 06/18/15 1248 Last data filed at 06/18/15 1212  Gross per 24 hour  Intake   1290 ml  Output   3700 ml  Net  -2410 ml      General: obese ill-appearing lady resting in bed comfortably with 4 L O2 Cardiac: regular rate and rhythm without murmurs Pulm: breathing well, some crackles in right lower lung base table from prior exam, left lower lung base tympanic to palpation with no lung sounds Abd: bowel sounds normal, soft, nondistended, non-tender Skin: 1. Both extremities have lower brawny indurated plaques consistent with chronic venous insufficiency 2. However, the left leg is markedly more edematous and tender with impressive overlying erythematous patches. There is a group of bullae over the anterior shin but no cutaneous anesthesia. Erythema continues to show improvement as compared to demarcated lines on admission.          Lab Results: Basic Metabolic Panel:  Recent Labs Lab 06/15/15 0527  06/17/15 0207 06/18/15 0218 06/18/15 1000  NA 141  < > 144 144  --   K 3.3*  < > 3.7 3.0*  --   CL 87*  < > 88* 83*  --   CO2 42*  < > 46* 50*  --   GLUCOSE 106*  < > 97 99  --   BUN 19  < > 14 8  --   CREATININE 0.75  < > 0.72 0.62  --   CALCIUM 8.2*  < > 8.2* 8.2*  --     MG 1.7  --   --   --  1.6*  < > = values in this interval not displayed. CBC:  Recent Labs Lab 06/14/15 1252  06/17/15 0207 06/18/15 0218  WBC 14.1*  < > 7.7 6.7  NEUTROABS 13.1*  --   --   --   HGB 12.4  < > 11.8* 11.3*  HCT 42.2  < > 40.1 40.1  MCV 99.5  < > 102.0* 101.0*  PLT 134*  < > 155 194  < > = values in this interval not displayed.   Micro Results: 1 out of 2 blood cultures from 4/2 are growing gram-positive rods  Medications: I have reviewed the patient's current medications. Scheduled Meds: . ampicillin-sulbactam (UNASYN) IV  3 g Intravenous Q6H  . antiseptic oral rinse  7 mL Mouth Rinse q12n4p  . chlorhexidine  15 mL Mouth Rinse BID  . Chlorhexidine Gluconate Cloth  6 each Topical Q0600  . furosemide  80 mg Oral BID  . lidocaine  1 patch Transdermal Q24H  . linezolid (ZYVOX) IV  600 mg  Intravenous Q12H  . magnesium sulfate 1 - 4 g bolus IVPB  4 g Intravenous Once  . mupirocin ointment  1 application Nasal BID  . potassium chloride  40 mEq Oral BID  . rivaroxaban  20 mg Oral Q supper  . simvastatin  20 mg Oral QHS  . sodium chloride flush  3 mL Intravenous Q12H   Continuous Infusions:  PRN Meds:.acetaminophen, ipratropium-albuterol, ondansetron (ZOFRAN) IV   Assessment/Plan:  [name] 75 year old lady with heart failure with preserved ejection fraction, obesity hypoventilation syndrome, and paroxysmal atrial fibrillation on rivaroxaban presenting with acute hypoxic and hypercarbic respiratory failure and left lower extremity cellulitis.  Left leg cellulitis: We've ruled out necrotizing fasciitis with a normal MRI. Fortunately her cellulitis appears to be improving on paraenteral antibiotics. We'll continue them for a 7-10 day course, depending on how she does. One out of two blood cultures grew gram positive rods and is likely skin-contaminant. -Continue linezolid and ampicillin-sulbactam [Day 4/7-10]  Acute hypoxic and hypercarbic respiratory failure:  Fortunately this has resolved. I think she has underlying obesity hypoventilation syndrome and has some atelectasis going on as well. She appears euvolemic so we'll continue her home diuretic regimen. We'll continue BiPap at night along with an incentive spirometer.  -Continue BiPap at night -Continue furosemide 80mg  twice daily -Follow K/Mg and replete daily -Encouraged her to use the incentive spirometer  Paroxysmal atrial fibrillation: Sinus rhythm on exam today. Heart rate has been trending up to 113 to 130s since midnight. -Resume home metoprolol XL 50mg  daily -Continue home rivaroxaban 20mg  daily  #FEN:  -Diet: Dysphagia 2  #DVT prophylaxis: Lovenox  #CODE STATUS: FULL CODE -Defer to sister Misty Buckley [phone]] if patients lacks decision-making capacity -Confirmed with patient on admission  Dispo: Disposition is deferred at this time, awaiting improvement of current medical problems.  Anticipated discharge in approximately 2-5 day(s).   The patient does have a current PCP Misty Buckley(Misty N Koehler, MD) and [facility] after discharge.  The patient does have transportation limitations that hinder transportation to clinic appointments.  .Services Needed at time of discharge: Y = Yes, Blank = No PT:   OT:   RN:   Equipment:   Other:     LOS: 4 days   Beather Arbourushil Rumi Taras V, MD 06/18/2015, 12:48 PM

## 2015-06-19 DIAGNOSIS — R410 Disorientation, unspecified: Secondary | ICD-10-CM

## 2015-06-19 DIAGNOSIS — R400 Somnolence: Secondary | ICD-10-CM

## 2015-06-19 LAB — CBC
HCT: 42.5 % (ref 36.0–46.0)
Hemoglobin: 12.4 g/dL (ref 12.0–15.0)
MCH: 29 pg (ref 26.0–34.0)
MCHC: 29.2 g/dL — ABNORMAL LOW (ref 30.0–36.0)
MCV: 99.3 fL (ref 78.0–100.0)
PLATELETS: 222 10*3/uL (ref 150–400)
RBC: 4.28 MIL/uL (ref 3.87–5.11)
RDW: 14.5 % (ref 11.5–15.5)
WBC: 6.8 10*3/uL (ref 4.0–10.5)

## 2015-06-19 LAB — COMPREHENSIVE METABOLIC PANEL
ALBUMIN: 2.2 g/dL — AB (ref 3.5–5.0)
ALT: 16 U/L (ref 14–54)
AST: 30 U/L (ref 15–41)
Alkaline Phosphatase: 76 U/L (ref 38–126)
Anion gap: 13 (ref 5–15)
BUN: 8 mg/dL (ref 6–20)
CHLORIDE: 83 mmol/L — AB (ref 101–111)
CO2: 48 mmol/L — ABNORMAL HIGH (ref 22–32)
Calcium: 8.6 mg/dL — ABNORMAL LOW (ref 8.9–10.3)
Creatinine, Ser: 0.68 mg/dL (ref 0.44–1.00)
GFR calc Af Amer: >60 mL/min (ref >60)
GLUCOSE: 119 mg/dL — AB (ref 65–99)
POTASSIUM: 3 mmol/L — AB (ref 3.5–5.1)
SODIUM: 144 mmol/L (ref 135–145)
Total Bilirubin: 0.8 mg/dL (ref 0.3–1.2)
Total Protein: 5.9 g/dL — ABNORMAL LOW (ref 6.5–8.1)

## 2015-06-19 LAB — POCT I-STAT 3, ART BLOOD GAS (G3+)
Acid-Base Excess: 28 mmol/L — ABNORMAL HIGH (ref 0.0–2.0)
Bicarbonate: 55.7 mEq/L — ABNORMAL HIGH (ref 20.0–24.0)
O2 Saturation: 94 %
PCO2 ART: 61.1 mmHg — AB (ref 35.0–45.0)
PH ART: 7.568 — AB (ref 7.350–7.450)
Patient temperature: 98.6
pO2, Arterial: 66 mmHg — ABNORMAL LOW (ref 80.0–100.0)

## 2015-06-19 LAB — CULTURE, BLOOD (ROUTINE X 2): CULTURE: NO GROWTH

## 2015-06-19 LAB — BASIC METABOLIC PANEL
Anion gap: 13 (ref 5–15)
BUN: 7 mg/dL (ref 6–20)
CALCIUM: 8.5 mg/dL — AB (ref 8.9–10.3)
CO2: 48 mmol/L — ABNORMAL HIGH (ref 22–32)
CREATININE: 0.57 mg/dL (ref 0.44–1.00)
Chloride: 83 mmol/L — ABNORMAL LOW (ref 101–111)
Glucose, Bld: 115 mg/dL — ABNORMAL HIGH (ref 65–99)
Potassium: 3 mmol/L — ABNORMAL LOW (ref 3.5–5.1)
Sodium: 144 mmol/L (ref 135–145)

## 2015-06-19 LAB — GLUCOSE, CAPILLARY: GLUCOSE-CAPILLARY: 119 mg/dL — AB (ref 65–99)

## 2015-06-19 LAB — MAGNESIUM: MAGNESIUM: 2.1 mg/dL (ref 1.7–2.4)

## 2015-06-19 MED ORDER — POTASSIUM CHLORIDE CRYS ER 20 MEQ PO TBCR
40.0000 meq | EXTENDED_RELEASE_TABLET | Freq: Two times a day (BID) | ORAL | Status: AC
  Administered 2015-06-19 (×2): 40 meq via ORAL
  Filled 2015-06-19 (×2): qty 2

## 2015-06-19 MED ORDER — SENNOSIDES-DOCUSATE SODIUM 8.6-50 MG PO TABS
2.0000 | ORAL_TABLET | Freq: Two times a day (BID) | ORAL | Status: DC
  Administered 2015-06-19 – 2015-06-20 (×3): 2 via ORAL
  Filled 2015-06-19 (×3): qty 2

## 2015-06-19 MED ORDER — ACETAMINOPHEN 500 MG PO TABS
500.0000 mg | ORAL_TABLET | ORAL | Status: DC
  Administered 2015-06-19 – 2015-06-21 (×12): 500 mg via ORAL
  Filled 2015-06-19 (×11): qty 1

## 2015-06-19 MED ORDER — ACETAZOLAMIDE ER 500 MG PO CP12
500.0000 mg | ORAL_CAPSULE | Freq: Two times a day (BID) | ORAL | Status: DC
  Administered 2015-06-19 (×2): 500 mg via ORAL
  Filled 2015-06-19 (×3): qty 1

## 2015-06-19 MED ORDER — METOPROLOL SUCCINATE ER 25 MG PO TB24
25.0000 mg | ORAL_TABLET | Freq: Every day | ORAL | Status: DC
  Administered 2015-06-19 – 2015-06-20 (×2): 25 mg via ORAL
  Filled 2015-06-19 (×2): qty 1

## 2015-06-19 NOTE — Progress Notes (Signed)
Dr.Patel made aware of patients confusion with inability to tell me where she is, the date , very hesitate on telling me her name, gritting teeth but denies pain. MD in to see patient. Will place patient on Bipap and monitor closely. Patient follows commands, MAE x 4

## 2015-06-19 NOTE — Progress Notes (Signed)
Patient ID: Misty Buckley, female   DOB: [birth year], 75 y.o.   MRN: [id number]   Subjective: Misty Buckley was less alert and conversational today compared to Friday. She was able to tell me her name but was lethargic and unable to carry on a full conversation.   Objective: Vital signs in last 24 hours: Filed Vitals:   06/19/15 0000 06/19/15 0100 06/19/15 0200 06/19/15 0815  BP: 97/67  118/63 128/85  Pulse: 92 91 46 90  Temp:    98 F (36.7 C)  TempSrc:    Axillary  Resp: Height:      Weight:      SpO2: 92% 93% 95% 96%   Weight change:   Intake/Output Summary (Last 24 hours) at 06/19/15 0839 Last data filed at 06/19/15 4098  Gross per 24 hour  Intake   2530 ml  Output   5400 ml  Net  -2870 ml   General: obese ill-appearing lady lying in bed, somnolent but arousable, with mild tremors, oriented only to self, not able to carry on a full conversation due to somnolence Cardiac: irregular rate and rhythm, no rubs, murmurs or gallops Pulm: breathing well, clear to auscultation bilaterally but exam is very difficult due to habitus Abd: bowel sounds normal, soft, nondistended, non-tender Skin: the left leg cellulitis appears to have regressed entirely accept for one focal area of petechiae on her inner groin  Neuro: alert and oriented X1, cranial nerves II-XII grossly intact, moving all extremities well  Lab Results: Basic Metabolic Panel:  Recent Labs Lab 06/18/15 0218 06/18/15 1000 06/19/15 0218  NA 144  --  144  144  K 3.0*  --  3.0*  3.0*  CL 83*  --  83*  83*  CO2 50*  --  48*  48*  GLUCOSE 99  --  119*  115*  BUN 8  --  8  7  CREATININE 0.62  --  0.68  0.57  CALCIUM 8.2*  --  8.6*  8.5*  MG  --  1.6* 2.1   Liver Function Tests:  Recent Labs Lab 06/14/15 1252 06/19/15 0218  AST 31 30  ALT 17 16  ALKPHOS 84 76  BILITOT 0.9 0.8  PROT 6.1* 5.9*  ALBUMIN 2.6* 2.2*   CBC:  Recent Labs Lab 06/14/15 1252  06/18/15 0218 06/19/15 0218  WBC  14.1*  < > 6.7 6.8  NEUTROABS 13.1*  --   --   --   HGB 12.4  < > 11.3* 12.4  HCT 42.2  < > 40.1 42.5  MCV 99.5  < > 101.0* 99.3  PLT 134*  < > 194 222  < > = values in this interval not displayed.   Micro: 4/2: One out of 2 blood cultures grew diphtheroid  Medications: I have reviewed the patient's current medications. Scheduled Meds: . ampicillin-sulbactam (UNASYN) IV  3 g Intravenous Q6H  . antiseptic oral rinse  7 mL Mouth Rinse q12n4p  . chlorhexidine  15 mL Mouth Rinse BID  . Chlorhexidine Gluconate Cloth  6 each Topical Q0600  . lidocaine  1 patch Transdermal Q24H  . linezolid (ZYVOX) IV  600 mg Intravenous Q12H  . metoprolol succinate  25 mg Oral Daily  . mupirocin ointment  1 application Nasal BID  . potassium chloride  40 mEq Oral BID  . rivaroxaban  20 mg Oral Q supper  . simvastatin  20 mg Oral QHS  . sodium chloride flush  3 mL Intravenous Q12H   Continuous Infusions:  PRN Meds:.acetaminophen, ipratropium-albuterol, ondansetron (ZOFRAN) IV   Assessment/Plan:  Ms. Barry Buckley ia 75 year old lady with heart failure with preserved ejection fraction, obesity hypoventilation syndrome, and paroxysmal atrial fibrillation on rivaroxaban presenting with left lower extremity cellulitis and acute hypoxic and hypercarbic respiratory failure. Over the last 2 days she has become progressively somnolent.  Somnolence and confusion: Over the last two days, she has become progressively somnolent and confused; I think this is primarily driven by hypercarbia driven by hyperoxygenation; I think her oxygen saturations hanging in the 100% range are dropping her ventilatory drive and she's not exhaling her CO2. We'll shoot to keep her saturations in the 90-95% range. I also suspect she is now approaching hypovolemia as evidenced by her increased bicarbonate so we'll hold her diuretics today. She has some tremors on exam which is likely explained by holding her metoprolol as she has a history of  essential tremor in the past. Her corrected calcium is normal so I doubt this is true tetany. I also doubt she is having seizures. Additionally, she has complained of electricity shooting through her hair, which is a sign of SSRI-discontinuation symptoms; we held her citalopram when we started linezolid to prevent serotonin syndrome. I think abruptly stopping the citalopram is further contributing to her somnolence but is not the main cause. -Holding furosemide today -Keeping oxygen saturation 90-95% both day and night -Will establish goals of care today with patient and family  Left leg cellulitis: This is improving dramatically on linezolid and ampicillin-sulbactam. She has an area of petechiae on her inner groin but this is localized and does not extend into her perineum. We'll hold the course and keep a close eye on this. -Continue linezolid and ampicillin-sulbactam (day 5/7)  Paroxysmal atrial fibrillation: She's back in atrial fibrillation on exam today. We'll rate-control with low dose metoprolol. -Start metoprolol XL 25mg  daily -Continue home rivaroxaban 20mg  daily  Dispo: Disposition is deferred at this time, awaiting improvement of current medical problems.  Anticipated discharge in approximately 2-5 day(s).   The patient does have a current PCP Jethro Bastos(Robert N Koehler, MD) and does [facility] after discharge.  The patient does have transportation limitations that hinder transportation to clinic appointments.  .Services Needed at time of discharge: Y = Yes, Blank = No PT:   OT:   RN:   Equipment:   Other:     LOS: 5 days   Selina CooleyKyle Britny Riel, MD 06/19/2015, 8:39 AM  ADDENDUM:  I had a lengthy discussion with Misty Buckley son, Misty Buckley, and her sister, Misty Buckley; they emphasized Ms. Barry Buckley had expressed to them she did not want to be on a breathing machine, nor want any heroic measures to save her life. These two family members had communicated with her other three  sons, who are all in agreement with her being DNR. I've changed her to DNR in the chart accordingly.  Selina CooleyKyle Kristiann Noyce MD

## 2015-06-19 NOTE — Progress Notes (Signed)
[facility] and is preferred if available at time of dc. Patient is a [facility] community based patient and appears not medically ready at this time per MD note-  Reece LevyJanet Schneur Crowson, MSW, LCSW

## 2015-06-19 NOTE — Progress Notes (Signed)
Have made three attempts to reach MD with IM, will place ABG orders

## 2015-06-19 NOTE — Progress Notes (Signed)
eLink Physician-Brief Progress Note Patient Name: Misty Buckley DOB: Jul 13, 1940 MRN: [id number]   Date of Service  06/19/2015  HPI/Events of Note  Hypokalemia  eICU Interventions  Potassium replaced     Intervention Category Intermediate Interventions: Electrolyte abnormality - evaluation and management  Pieter Fooks 06/19/2015, 4:22 AM

## 2015-06-19 NOTE — Progress Notes (Signed)
Dr.Patel made aware of ABG results with ongoing confusion, gritting of teeth and facial twitching. MD said he would look at the chart. Unable to keep bipap on patient. At this very moment, O2 4 liters on with patient asleep. Md aware

## 2015-06-19 NOTE — Progress Notes (Signed)
Notified Dr.Flores of inability to wean oxygen from 4 liters nasal cannula. When pt awake sats 92-94 on 2 liters, but as pt falls asleep, sats drop to 84-85 on 2 liter nasal cannula. Pt unable to stay awake, is alert to self, but not time, place or situation, and only follows basic commands. Pt is not alert enough to safely ambulate at this time. Will continue to monitor pt closely.

## 2015-06-20 DIAGNOSIS — G4733 Obstructive sleep apnea (adult) (pediatric): Secondary | ICD-10-CM

## 2015-06-20 LAB — BASIC METABOLIC PANEL
ANION GAP: 13 (ref 5–15)
BUN: 15 mg/dL (ref 6–20)
CO2: 36 mmol/L — AB (ref 22–32)
CREATININE: 0.85 mg/dL (ref 0.44–1.00)
Calcium: 8.9 mg/dL (ref 8.9–10.3)
Chloride: 91 mmol/L — ABNORMAL LOW (ref 101–111)
GFR calc non Af Amer: >60 mL/min (ref >60)
Glucose, Bld: 110 mg/dL — ABNORMAL HIGH (ref 65–99)
Potassium: 3.6 mmol/L (ref 3.5–5.1)
Sodium: 140 mmol/L (ref 135–145)

## 2015-06-20 MED ORDER — ADULT MULTIVITAMIN W/MINERALS CH
1.0000 | ORAL_TABLET | Freq: Every day | ORAL | Status: DC
  Administered 2015-06-20 – 2015-06-21 (×2): 1 via ORAL
  Filled 2015-06-20 (×2): qty 1

## 2015-06-20 MED ORDER — METOPROLOL SUCCINATE ER 50 MG PO TB24
50.0000 mg | ORAL_TABLET | Freq: Every day | ORAL | Status: DC
  Administered 2015-06-21: 50 mg via ORAL
  Filled 2015-06-20: qty 1

## 2015-06-20 MED ORDER — ENSURE ENLIVE PO LIQD
237.0000 mL | Freq: Two times a day (BID) | ORAL | Status: DC
Start: 2015-06-20 — End: 2015-06-21
  Administered 2015-06-20 – 2015-06-21 (×3): 237 mL via ORAL

## 2015-06-20 NOTE — Progress Notes (Signed)
Pharmacy Antibiotic Note  Misty Buckley is a 75 y.o. female admitted on 06/14/2015 with cellulitis.  Pharmacy has been consulted for zyvox/unasyn dosing.  Ruled out necrotizing fascitis, cellulitis improving on current regimen. Current issues involving respiratory.  No fevers noted, wbc normal, scr normal. No dose adjustments needed at this time. Will follow peripherally for now.  Plan: Unasyn 3g q6 hours Zyvox 600mg  IV bid   Height: 5\' 5"  (165.1 cm) Weight: 258 lb 13.1 oz (117.4 kg) IBW/kg (Calculated) : 57  Temp (24hrs), Avg:98 F (36.7 C), Min:97.4 F (36.3 C), Max:98.5 F (36.9 C)   Recent Labs Lab 06/14/15 1259 06/15/15 0527 06/16/15 0418 06/17/15 0207 06/18/15 0218 06/19/15 0218  WBC  --  13.5* 10.4 7.7 6.7 6.8  CREATININE  --  0.75 0.63 0.72 0.62 0.68  0.57  LATICACIDVEN 1.77  --   --   --   --   --     Estimated Creatinine Clearance: 79.1 mL/min (by C-G formula based on Cr of 0.57).    Allergies  Allergen Reactions  . Hydromorphone Other (See Comments)    Decreased B/P & HR  . Morphine Other (See Comments)    Decreased B/P & HR  . Risperidone And Related Other (See Comments)    Mentally confused/ drooling  . Vancomycin Other (See Comments)    May have caused renal issues  . Zolpidem Other (See Comments)    Confusion,  Dizziness, and hallucinations    Antimicrobials this admission: Ancef 4/4 x 1 dose Cefepime 4/4>>4/5 vanc 4/4>>4/5 Unasyn 4/5>> Zyvox 4/5>>  Microbiology results: 4/4 Ucx>>ngF 4/4 BCx2>>1/2 dipthroids MRSA +  Thank you for allowing pharmacy to be a part of this patient's care.  Sheppard CoilFrank Wilson PharmD., BCPS Clinical Pharmacist Pager [phone] 06/20/2015 9:32 AM

## 2015-06-20 NOTE — Progress Notes (Signed)
Physical Therapy Treatment Patient Details Name: Misty Buckley MRN: [id number] DOB: Aug 25, 1940 Today's Date: 06/20/2015    History of Present Illness 75 yo F with a PMHx of A-fib, OSA, CHF, HTN, morbid obesity presenting from her rehabilitation facility due to worsening shortness of breath    PT Comments    Pt with more confusion compared to Friday however has improved compared to over the weekend. Pt did demo improved ambulation tolerance this date but con't to require assist for all mobility. Con't to recommend SNF upon d/c.  Follow Up Recommendations  SNF;Supervision/Assistance - 24 hour     Equipment Recommendations  None recommended by PT    Recommendations for Other Services       Precautions / Restrictions Precautions Precautions: Fall Restrictions Weight Bearing Restrictions: No    Mobility  Bed Mobility               General bed mobility comments: pt up in chair  Transfers Overall transfer level: Needs assistance Equipment used: Rolling walker (2 wheeled) Transfers: Sit to/from Stand Sit to Stand: Min assist Stand pivot transfers: Min assist;+2 safety/equipment       General transfer comment[facility] for walker management and for safety, 2nd person for lines management  Ambulation/Gait Ambulation/Gait assistance: Min assist;+2 safety/equipment Ambulation Distance (Feet): 50 Feet (x2) Assistive device: Rolling walker (2 wheeled) Gait Pattern/deviations: Decreased stride length;Step-through pattern Gait velocity: dec Gait velocity interpretation: Below normal speed for age/gender General Gait Details: v/c's for walker safety, + SOB, but SPO2 >90% on 3Lo2 via [place]. seated rest break   Stairs            Wheelchair Mobility    Modified Rankin (Stroke Patients Only)       Balance Overall balance assessment: Needs assistance         Standing balance support: Bilateral upper extremity supported Standing balance-Leahy Scale:  Poor Standing balance comment: requires RW for safe standing                    Cognition Arousal/Alertness: Awake/alert Behavior During Therapy: Impulsive Overall Cognitive Status: Impaired/Different from baseline Area of Impairment: Orientation;Following commands;Memory;Problem solving Orientation Level: Disoriented to;Time;Situation   Memory: Decreased short-term memory Following Commands: Follows one step commands consistently;Follows one step commands with increased time     Problem Solving: Difficulty sequencing;Requires verbal cues;Requires tactile cues General Comments: pt more confused than friday however improved from over the weekend    Exercises      General Comments        Pertinent Vitals/Pain Pain Assessment: No/denies pain    Home Living                      Prior Function            PT Goals (current goals can now be found in the care plan section) Progress towards PT goals: Progressing toward goals    Frequency  Min 3X/week    PT Plan Current plan remains appropriate    Co-evaluation             End of Session Equipment Utilized During Treatment: Gait belt;Oxygen Activity Tolerance: Patient tolerated treatment well Patient left: in chair;with call bell/phone within reach;with chair alarm set;with family/visitor present;with nursing/sitter in room     Time: 1142-1208 PT Time Calculation (min) (ACUTE ONLY): 26 min  Charges:  $Gait Training: 8-22 mins $Therapeutic Activity: 8-22 mins  G Codes:      Marcene Brawn 06/20/2015, 12:45 PM   Lewis Shock, PT, DPT Pager #: [phone] Office #: [phone]

## 2015-06-20 NOTE — Clinical Social Work Note (Signed)
Clinical IT sales professionalocia Worker received call from [facility], Marylu LundJanet in regards to discharge planning/date. [facility] medically stable for [facility].  [facility] rep: Marylu LundJanet [phone]  CSW remains available as needed.   Derenda FennelBashira Olympia Adelsberger, MSW, LCSWA [phone] 06/20/2015 10:34 AM

## 2015-06-20 NOTE — Progress Notes (Signed)
Patient ID: Misty Buckley, female   DOB: 10/23/1940, 75 y.o.   MRN: [id number]   Subjective: Misty Buckley is more alert and cogent today; she knows her name and can say she is in the hospital with some prompting. She feels her breathing is back to normal and her left leg pain has entirely resolved. She has no complaints.  Objective: Vital signs in last 24 hours: Filed Vitals:   06/20/15 0200 06/20/15 0400 06/20/15 0825 06/20/15 0958  BP: 112/74 115/66 138/82   Pulse:   91 88  Temp:  98.2 F (36.8 C) 98.2 F (36.8 C)   TempSrc:  Oral Oral   Resp: Height:      Weight:      SpO2:   96%    Weight change:   Intake/Output Summary (Last 24 hours) at 06/20/15 1052 Last data filed at 06/20/15 0953  Gross per 24 hour  Intake   2040 ml  Output    350 ml  Net   1690 ml   General: obese ill-appearing lady lying in bed, somnolent but arousable, with mild tremors, oriented only to self, not able to carry on a full conversation due to somnolence Cardiac: irregular rate and rhythm, no rubs, murmurs or gallops Pulm: breathing well, clear to auscultation bilaterally but exam is very difficult due to habitus Abd: bowel sounds normal, soft, nondistended, non-tender Skin: the left leg cellulitis appears to have regressed entirely accept for one focal area of petechiae on her inner groin  Lab Results: Basic Metabolic Panel:  Recent Labs Lab 06/18/15 1000 06/19/15 0218 06/20/15 0925  NA  --  144  144 140  K  --  3.0*  3.0* 3.6  CL  --  83*  83* 91*  CO2  --  48*  48* 36*  GLUCOSE  --  119*  115* 110*  BUN  --  CREATININE  --  0.68  0.57 0.85  CALCIUM  --  8.6*  8.5* 8.9  MG 1.6* 2.1  --    Medications: I have reviewed the patient's current medications. Scheduled Meds: . acetaminophen  500 mg Oral Q4H  . ampicillin-sulbactam (UNASYN) IV  3 g Intravenous Q6H  . antiseptic oral rinse  7 mL Mouth Rinse q12n4p  . chlorhexidine  15 mL Mouth Rinse BID  . lidocaine   1 patch Transdermal Q24H  . linezolid (ZYVOX) IV  600 mg Intravenous Q12H  . metoprolol succinate  25 mg Oral Daily  . rivaroxaban  20 mg Oral Q supper  . senna-docusate  2 tablet Oral BID  . simvastatin  20 mg Oral QHS  . sodium chloride flush  3 mL Intravenous Q12H   Continuous Infusions:  PRN Meds:.ipratropium-albuterol, ondansetron (ZOFRAN) IV   Assessment/Plan:  Misty Buckley ia 75 year old lady with heart failure with preserved ejection fraction, obesity hypoventilation syndrome, and paroxysmal atrial fibrillation on rivaroxaban presenting with left lower extremity cellulitis and acute hypoxic and hypercarbic respiratory failure.   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 We'll need to see if [facility] can accept BiPap patients as Pulmonology recommends she wear this at night for her OHS. -Keeping oxygen saturation 90-95% both day and night -Will contact social work to see if SNF can  accept BiPap patients  Left leg cellulitis: This has dramatically improved on linezolid and ampicillin-sulbactam. She has an area of petechiae on her inner groin but this is localized and does not extend into her perineum. We'll hold the course and keep a close eye on this. -Continue linezolid and ampicillin-sulbactam for another 1-2 days -Will likely discharge on short course of doxycyline  Paroxysmal atrial fibrillation: She's in atrial fibrillation on exam today. Her rates have been in the upper 90s so we'll increase her metoprolol. -Increased metoprolol succinate from 25 to 50mg  daily  Dispo: Disposition is deferred at this time, awaiting improvement of current medical problems.  Anticipated discharge in approximately 2-3 day(s).   The patient does have a current PCP  Misty Buckley(Misty N Koehler, MD) and [facility] after discharge.  The patient does have transportation limitations that hinder transportation to clinic appointments.  .Services Needed at time of discharge: Y = Yes, Blank = No PT:   OT:   RN:   Equipment:   Other:     LOS: 6 days   Selina CooleyKyle Orenthal Debski, MD 06/20/2015, 10:52 AM

## 2015-06-20 NOTE — Care Management Important Message (Signed)
Important Message  Patient Details  Name: Misty Buckley MRN: [id number] Date of Birth: Jul 14, 1940   Medicare Important Message Given:  Yes    Hanley Haysowell, Mckaylah Bettendorf T, RN 06/20/2015, 3:01 PM

## 2015-06-20 NOTE — Progress Notes (Signed)
Nutrition Follow-up  DOCUMENTATION CODES:   Obesity unspecified  INTERVENTION:   -Ensure Enlive po BID, each supplement provides 350 kcal and 20 grams of protein -MVI daily  NUTRITION DIAGNOSIS:   Increased nutrient needs related to wound healing as evidenced by estimated needs.  Ongoing  GOAL:   Patient will meet greater than or equal to 90% of their needs  Unmet  MONITOR:   PO intake, Supplement acceptance, Labs, Weight trends, Skin, I & O's  REASON FOR ASSESSMENT:   Low Braden    ASSESSMENT:   Patient is a 75 yo F with a PMHx of A-fib, OSA, CHF, HTN, morbid obesity presenting from her rehabilitation facility due to worsening shortness of breath. Patient states she has a long history of shortness of breath and is unable to state when her shortness of breath worsened. States she uses 3-4 L O2 via [place] at home. She denies having any chest pain or cough. Reports having swelling in her legs for 3 weeks and does admit to having symptoms of orthopnea  Pt sitting in recliner at time of visit, with multiple providers in room. Staff report that pt is alert only to self.   Intake is variable, likely due to confusion. Meal completion 25-75%.  Reviewed CWOCN note on 06/15/15; pt with lt leg cellulitis and rt leg full thickness wounds. Pt with increased nutritional needs due to wound healing.   CSW following. Plan to d/c to SNF once medically stable.   Labs reviewed: K: 3.0  Diet Order:  DIET DYS 2 Room service appropriate?: Yes; Fluid consistency:: Thin; Fluid restriction:: Other (see comments)  Skin:  Wound (see comment) (lt leg cellulitis, rt leg full thickness wound)  Last BM:  06/20/15  Height:   Ht Readings from Last 1 Encounters:  06/14/15 5\' 5"  (1.651 m)    Weight:   Wt Readings from Last 1 Encounters:  06/18/15 258 lb 13.1 oz (117.4 kg)    Ideal Body Weight:  56.81 kg  BMI:  Body mass index is 43.07 kg/(m^2).  Estimated Nutritional Needs:   Kcal:   1800-2000  Protein:  100-155 grams  Fluid:  1.8-2.0 L  EDUCATION NEEDS:   No education needs identified at this time  Ferrah Panagopoulos A. Mayford KnifeWilliams, RD, LDN, CDE Pager: [phone] After hours Pager: [phone]

## 2015-06-20 NOTE — Progress Notes (Signed)
Pt placed on BiPap at this time; tolerating well.

## 2015-06-20 NOTE — Progress Notes (Signed)
PCCM PROGRESS NOTE  Admission date: 06/14/2015  Chief complaint: Short of breath  Subjective: No events overnight.  Vital signs: BP 106/85 mmHg  Pulse 83  Temp(Src) 97.7 F (36.5 C) (Oral)  Resp 18  Ht 5\' 5"  (1.651 m)  Wt 117.4 kg (258 lb 13.1 oz)  BMI 43.07 kg/m2  SpO2 97%  Intake/output: I/O last 3 completed shifts: In: 3000 [P.O.:900; IV Piggyback:2100] Out: 3250 [Urine:3250]  General: pleasant, sitting in chair with PT. Neuro: alert, normal strength HEENT: no stridor Cardiac: RRR, Nl S1/S2, -M/R/G. Chest: decreased BS Lt base, no wheeze Abd: soft, non tender Ext: 1+ edema Skin: decreased erythema Lt leg   CMP Latest Ref Rng 06/20/2015 06/19/2015 06/19/2015  Glucose 65 - 99 mg/dL 782(N110(H) 562(Z119(H) 308(M115(H)  BUN 6 - 20 mg/dL 15 8 7   Creatinine 0.44 - 1.00 mg/dL 5.780.85 4.690.68 6.290.57  Sodium 135 - 145 mmol/L 140 144 144  Potassium 3.5 - 5.1 mmol/L 3.6 3.0(L) 3.0(L)  Chloride 101 - 111 mmol/L 91(L) 83(L) 83(L)  CO2 22 - 32 mmol/L 36(H) 48(H) 48(H)  Calcium 8.9 - 10.3 mg/dL 8.9 5.2(W8.6(L) 4.1(L8.5(L)  Total Protein 6.5 - 8.1 g/dL - 5.9(L) -  Total Bilirubin 0.3 - 1.2 mg/dL - 0.8 -  Alkaline Phos 38 - 126 U/L - 76 -  AST 15 - 41 U/L - 30 -  ALT 14 - 54 U/L - 16 -    CBC Latest Ref Rng 06/19/2015 06/18/2015 06/17/2015  WBC 4.0 - 10.5 K/uL 6.8 6.7 7.7  Hemoglobin 12.0 - 15.0 g/dL 24.412.4 11.3(L) 11.8(L)  Hematocrit 36.0 - 46.0 % 42.5 40.1 40.1  Platelets 150 - 400 K/uL 222 194 155    ABG    Component Value Date/Time   PHART 7.568* 06/19/2015 0054   PCO2ART 61.1* 06/19/2015 0054   PO2ART 66.0* 06/19/2015 0054   HCO3 55.7* 06/19/2015 0054   TCO2 >50 06/19/2015 0054   O2SAT 94.0 06/19/2015 0054    No results found.  Studies:  PSG 05/17/15 >> AHI 2.9 CT chest 06/15/15 >> mosaic appearance of Rt lung, elevated Lt hemidiaphragm Doppler legs 06/15/15 >> no DVT  Cultures: 4/05 Influenza PCR >> negative  Antibiotics: 4/04 Ancef >> 4/04 4/04 Cefepime >> 4/04  4/04 Vancomycin >> 4/04  4/05  Linezolid >> 4/05 Unasyn >>   Discussion: 75 yo female presented from rehab facility with progressive dyspnea, leg swelling and redness of Lt leg.  She has hx of HTN, A fib o xarelto, fibromyalgia.  I reviewed CXR myself, no acute change from prior.  Assessment/plan:  Acute on chronic hypoxic/hypercapnic respiratory failure 2nd to obesity hypoventilation syndrome and chronic Lt hemidiaphragm elevation. - oxygen to keep SpO2 90 to 95% - BiPAP qhs and prn >> using BiPAP 10/5 cm H2O >> she will need BiPAP ST as outpt with back up rate of 14.  Cellulitis Lt leg. - Abx per primary team  No family present bedside, discussed with RT.  PCCM will sign off, please call back if needed.  Alyson ReedyWesam G. Yacoub, M.D. [facility]. Pager: [phone]. After hours pager: [phone].

## 2015-06-21 DIAGNOSIS — J986 Disorders of diaphragm: Secondary | ICD-10-CM

## 2015-06-21 DIAGNOSIS — J810 Acute pulmonary edema: Secondary | ICD-10-CM

## 2015-06-21 LAB — BASIC METABOLIC PANEL
ANION GAP: 11 (ref 5–15)
Anion gap: 10 (ref 5–15)
BUN: 15 mg/dL (ref 6–20)
BUN: 16 mg/dL (ref 6–20)
CHLORIDE: 96 mmol/L — AB (ref 101–111)
CHLORIDE: 97 mmol/L — AB (ref 101–111)
CO2: 32 mmol/L (ref 22–32)
CO2: 33 mmol/L — ABNORMAL HIGH (ref 22–32)
Calcium: 8.6 mg/dL — ABNORMAL LOW (ref 8.9–10.3)
Calcium: 8.8 mg/dL — ABNORMAL LOW (ref 8.9–10.3)
Creatinine, Ser: 0.74 mg/dL (ref 0.44–1.00)
Creatinine, Ser: 0.88 mg/dL (ref 0.44–1.00)
GFR calc Af Amer: >60 mL/min (ref >60)
GFR calc Af Amer: >60 mL/min (ref >60)
GFR calc non Af Amer: >60 mL/min (ref >60)
GLUCOSE: 98 mg/dL (ref 65–99)
GLUCOSE: 113 mg/dL — AB (ref 65–99)
POTASSIUM: 2.9 mmol/L — AB (ref 3.5–5.1)
POTASSIUM: 3.5 mmol/L (ref 3.5–5.1)
SODIUM: 138 mmol/L (ref 135–145)
Sodium: 141 mmol/L (ref 135–145)

## 2015-06-21 LAB — MAGNESIUM: MAGNESIUM: 1.9 mg/dL (ref 1.7–2.4)

## 2015-06-21 MED ORDER — POTASSIUM CHLORIDE CRYS ER 20 MEQ PO TBCR
40.0000 meq | EXTENDED_RELEASE_TABLET | Freq: Once | ORAL | Status: AC
  Administered 2015-06-21: 40 meq via ORAL
  Filled 2015-06-21: qty 2

## 2015-06-21 MED ORDER — FUROSEMIDE 40 MG PO TABS: ORAL_TABLET | ORAL | Status: AC

## 2015-06-21 MED ORDER — POTASSIUM CHLORIDE 10 MEQ/100ML IV SOLN
10.0000 meq | INTRAVENOUS | Status: AC
  Administered 2015-06-21: 10 meq via INTRAVENOUS

## 2015-06-21 MED ORDER — MELATONIN 3 MG PO TABS
4.5000 mg | ORAL_TABLET | Freq: Once | ORAL | Status: AC
  Administered 2015-06-21: 4.5 mg via ORAL
  Filled 2015-06-21: qty 1.5

## 2015-06-21 MED ORDER — POTASSIUM CHLORIDE CRYS ER 20 MEQ PO TBCR: 40.0000 meq | EXTENDED_RELEASE_TABLET | Freq: Two times a day (BID) | ORAL | Status: DC

## 2015-06-21 MED ORDER — DICLOFENAC SODIUM 1 % TD GEL
2.0000 g | Freq: Four times a day (QID) | TRANSDERMAL | Status: DC
  Administered 2015-06-21 (×2): 2 g via TOPICAL
  Filled 2015-06-21 (×2): qty 100

## 2015-06-21 MED ORDER — SENNOSIDES-DOCUSATE SODIUM 8.6-50 MG PO TABS: 1.0000 | ORAL_TABLET | Freq: Every evening | ORAL | Status: AC | PRN

## 2015-06-21 MED ORDER — LUBRIDERM EX LOTN: TOPICAL_LOTION | CUTANEOUS | Status: DC

## 2015-06-21 MED ORDER — DICLOFENAC SODIUM 1 % TD GEL: TRANSDERMAL | Status: AC

## 2015-06-21 MED ORDER — ENSURE ENLIVE PO LIQD: 237.0000 mL | Freq: Two times a day (BID) | ORAL | Status: DC

## 2015-06-21 MED ORDER — DOXYCYCLINE HYCLATE 100 MG PO TABS: ORAL_TABLET | ORAL | Status: DC

## 2015-06-21 MED ORDER — POTASSIUM CHLORIDE 10 MEQ/100ML IV SOLN
10.0000 meq | INTRAVENOUS | Status: DC
  Filled 2015-06-21: qty 100

## 2015-06-21 NOTE — Clinical Social Work Placement (Signed)
   CLINICAL SOCIAL WORK PLACEMENT  NOTE  Date:  06/21/2015  Patient Details  Name: Misty Buckley MRN: [id number] Date of Birth: [birth year]  Clinical Social Work is seeking post-discharge placement for this patient at the Skilled  Nursing Facility level of care (*CSW will initial, date and re-position this form in  chart as items are completed):      Patient/family provided with [facility] Clinical Social Work Department's list of facilities offering this level of care within the geographic area requested by the patient (or if unable, by the patient's family).      Patient/family informed of their freedom to choose among providers that offer the needed level of care, that participate in Medicare, Medicaid or managed care program needed by the patient, have an available bed and are willing to accept the patient.      Patient/family informed of [facility] ownership interest in [facility] and [facility], as well as of the fact that they are under no obligation to receive care at these facilities.  PASRR submitted to EDS on       PASRR number received on       Existing PASRR number confirmed on 06/16/15     FL2 transmitted to all facilities in geographic area requested by pt/family on 06/16/15     FL2 transmitted to all facilities within larger geographic area on       Patient informed that his/her managed care company has contracts with or will negotiate with certain facilities, including the following:        Yes   Patient/family informed of bed offers received.  Patient chooses bed at  [facility])     Physician recommends and patient chooses bed at      Patient to be transferred to  [facility]) on 06/21/15.  Patient to be transferred to facility by  Sharin Mons([facility] )     Patient family notified on 06/21/15 of transfer.  Name of family member notified:   (Pt's son Misty NeedleMichael)     PHYSICIAN Please sign FL2, Please prepare  priority discharge summary, including medications     Additional Comment:    _______________________________________________ [facility] BrownerNixon, Astin Sayre A, LCSW 06/21/2015, 12:56 PM

## 2015-06-21 NOTE — Clinical Social Work Note (Signed)
BSW intern has arranged transport via [facility]. Patient's son, Casimiro NeedleMichael and bedside nurse has been made aware.  BSW intern is signing off. If any further Social Work needs arises, please re-consult.  Orson Gearatiana Sadee Osland- BSW intern [phone]

## 2015-06-21 NOTE — Progress Notes (Signed)
Patient ID: Misty Buckley, female   DOB: January 16, 1941, 75 y.o.   MRN: [id number]   Subjective: Misty Buckley is the most alert and cogent today than I've seen her this admission. She knows she is at [facility] and is pleased to hear she may be going to [facility] today. She's complaining of some mild neck pain but has no other complaints.  Objective: Vital signs in last 24 hours: Filed Vitals:   06/20/15 2345 06/21/15 0130 06/21/15 0318 06/21/15 0500  BP: 111/52  97/46   Pulse: 85  80   Temp:   98.7 F (37.1 C)   TempSrc:   Oral   Resp: 17  18   Height:      Weight:  249 lb 12.5 oz (113.3 kg)    SpO2: 92%  96% 96%   General: friendly lady resting in bed comfortably, appropriately conversational, the most alert I've seen her this admission Cardiac: regular rate and rhythm, no rubs, murmurs or gallops Pulm: breathing well, stable crackles in the right base Abd: bowel sounds normal, soft, nondistended, non-tender Ext: slightly tender over the C7 spinous process without redness or warmth, warm and well perfused, with 2+ pedal edema bilaterally Lymph: no cervical or supraclavicular lymphadenopathy Skin: left leg erythema has totally resolved. The leg is slightly more warm compared to the right Neuro: alert and oriented X3, cranial nerves II-XII grossly intact, moving all extremities well  Lab Results: Basic Metabolic Panel:  Recent Labs Lab 06/18/15 1000 06/19/15 0218 06/20/15 0925 06/21/15 0258  NA  --  144  144 140 138  K  --  3.0*  3.0* 3.6 2.9*  CL  --  83*  83* 91* 96*  CO2  --  48*  48* 36* 32  GLUCOSE  --  119*  115* 110* 98  BUN  --  CREATININE  --  0.68  0.57 0.85 0.74  CALCIUM  --  8.6*  8.5* 8.9 8.6*  MG 1.6* 2.1  --   --    Medications: I have reviewed the patient's current medications. Scheduled Meds: . acetaminophen  500 mg Oral Q4H  . ampicillin-sulbactam (UNASYN) IV  3 g Intravenous Q6H  . antiseptic oral rinse  7 mL Mouth Rinse  q12n4p  . chlorhexidine  15 mL Mouth Rinse BID  . feeding supplement (ENSURE ENLIVE)  237 mL Oral BID BM  . lidocaine  1 patch Transdermal Q24H  . linezolid (ZYVOX) IV  600 mg Intravenous Q12H  . metoprolol succinate  50 mg Oral Daily  . multivitamin with minerals  1 tablet Oral Daily  . potassium chloride  40 mEq Oral BID  . rivaroxaban  20 mg Oral Q supper  . senna-docusate  2 tablet Oral BID  . simvastatin  20 mg Oral QHS  . sodium chloride flush  3 mL Intravenous Q12H   Continuous Infusions:  PRN Meds:.ipratropium-albuterol, ondansetron (ZOFRAN) IV   Assessment/Plan:  Ms. Schmoker is 75 year old lady with heart failure with preserved ejection fraction, obesity hypoventilation syndrome, and paroxysmal atrial fibrillation on rivaroxaban presenting with left lower extremity cellulitis and acute hypoxic and hypercarbic respiratory failure.   Hypokalemia: Potassium 2.9 today. We'll replace and check another BMP this afternoon to make sure it's normalized. We won't be sending her home on furosemide. - K-Dur given -4 runs IV potassium ordered -BMP at noon  Left leg cellulitis: This has resolved on 7 days of linezolid and ampicillin-sulbactam. 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 She's good to go to [facility] today with BiPap at night. -Discharge to SNF today -Continue BiPap at night upon discharge  Paroxysmal atrial fibrillation: She's back in sinus rhythm on exam today on her home dose of metoprolol. -Continue metoprolol succinate 50mg  daily  Dispo: Discharge to SNF today if potassium normalizes.  The patient does have a current PCP Jethro Bastos(Robert N Koehler, MD) and [facility] after discharge.  The patient does have  transportation limitations that hinder transportation to clinic appointments.  .Services Needed at time of discharge: Y = Yes, Blank = No PT:   OT:   RN:   Equipment:   Other:     LOS: 7 days   Selina CooleyKyle Johnye Kist, MD 06/21/2015, 8:06 AM

## 2015-06-21 NOTE — Progress Notes (Signed)
Pt asked to be taken off of bipap machine, no longer tolerating well. Bipap placed on standby and pt placed on 2LNC. O2 sat 94%. Pt calm and comfortable with no complaints and not in respiratory distress. Will continue to monitor.

## 2015-06-21 NOTE — Progress Notes (Signed)
Patient placed back on bipap machine due to falling O2 sats. Pt is now asleep and appears comfortable.

## 2015-06-21 NOTE — Progress Notes (Signed)
Order for discharge noted. SW paged Re disch. x2 with no response.  SW on floor and informed RN that arrangements have been made for transportation via [facility]. Pt's sons aware of pt's discharge to [facility]. [facility] to [facility]([place], Charity fundraiserN). Pt transported by [facility]. All necessary paperwork sent with pt. VSS

## 2015-07-07 ENCOUNTER — Encounter: Payer: Self-pay | Admitting: *Deleted

## 2015-09-20 ENCOUNTER — Other Ambulatory Visit: Payer: Self-pay | Admitting: Family Medicine

## 2015-09-20 DIAGNOSIS — Z1231 Encounter for screening mammogram for malignant neoplasm of breast: Secondary | ICD-10-CM

## 2015-10-07 ENCOUNTER — Ambulatory Visit
Admission: RE | Admit: 2015-10-07 | Discharge: 2015-10-07 | Disposition: A | Discharge status: Home / Self Care | Payer: Medicare (Managed Care) | Source: Ambulatory Visit | Attending: Family Medicine | Admitting: Family Medicine

## 2015-10-07 DIAGNOSIS — Z1231 Encounter for screening mammogram for malignant neoplasm of breast: Secondary | ICD-10-CM

## 2015-10-19 ENCOUNTER — Encounter: Payer: Self-pay | Admitting: Internal Medicine

## 2015-10-19 NOTE — Progress Notes (Signed)
Opened in error

## 2015-11-01 ENCOUNTER — Inpatient Hospital Stay (HOSPITAL_COMMUNITY)
Admission: EM | Admit: 2015-11-01 | Discharge: 2015-11-04 | DRG: 602 | Disposition: A | Discharge status: Skilled Nursing Facility | Payer: Medicare (Managed Care) | Attending: Internal Medicine | Admitting: Internal Medicine

## 2015-11-01 ENCOUNTER — Encounter (HOSPITAL_COMMUNITY): Payer: Self-pay

## 2015-11-01 DIAGNOSIS — M47812 Spondylosis without myelopathy or radiculopathy, cervical region: Secondary | ICD-10-CM | POA: Diagnosis present

## 2015-11-01 DIAGNOSIS — I2510 Atherosclerotic heart disease of native coronary artery without angina pectoris: Secondary | ICD-10-CM | POA: Diagnosis present

## 2015-11-01 DIAGNOSIS — M1990 Unspecified osteoarthritis, unspecified site: Secondary | ICD-10-CM | POA: Diagnosis present

## 2015-11-01 DIAGNOSIS — I5033 Acute on chronic diastolic (congestive) heart failure: Secondary | ICD-10-CM | POA: Diagnosis present

## 2015-11-01 DIAGNOSIS — I872 Venous insufficiency (chronic) (peripheral): Secondary | ICD-10-CM | POA: Diagnosis not present

## 2015-11-01 DIAGNOSIS — E662 Morbid (severe) obesity with alveolar hypoventilation: Secondary | ICD-10-CM | POA: Diagnosis present

## 2015-11-01 DIAGNOSIS — K5900 Constipation, unspecified: Secondary | ICD-10-CM | POA: Diagnosis present

## 2015-11-01 DIAGNOSIS — Z6841 Body Mass Index (BMI) 40.0 and over, adult: Secondary | ICD-10-CM | POA: Diagnosis not present

## 2015-11-01 DIAGNOSIS — K5901 Slow transit constipation: Secondary | ICD-10-CM | POA: Diagnosis not present

## 2015-11-01 DIAGNOSIS — Z7901 Long term (current) use of anticoagulants: Secondary | ICD-10-CM | POA: Diagnosis not present

## 2015-11-01 DIAGNOSIS — G250 Essential tremor: Secondary | ICD-10-CM | POA: Diagnosis present

## 2015-11-01 DIAGNOSIS — I10 Essential (primary) hypertension: Secondary | ICD-10-CM | POA: Diagnosis not present

## 2015-11-01 DIAGNOSIS — G4700 Insomnia, unspecified: Secondary | ICD-10-CM | POA: Diagnosis present

## 2015-11-01 DIAGNOSIS — K649 Unspecified hemorrhoids: Secondary | ICD-10-CM | POA: Diagnosis present

## 2015-11-01 DIAGNOSIS — L03115 Cellulitis of right lower limb: Secondary | ICD-10-CM | POA: Diagnosis not present

## 2015-11-01 DIAGNOSIS — Z9119 Patient's noncompliance with other medical treatment and regimen: Secondary | ICD-10-CM

## 2015-11-01 DIAGNOSIS — Z86718 Personal history of other venous thrombosis and embolism: Secondary | ICD-10-CM

## 2015-11-01 DIAGNOSIS — M5030 Other cervical disc degeneration, unspecified cervical region: Secondary | ICD-10-CM | POA: Diagnosis present

## 2015-11-01 DIAGNOSIS — I480 Paroxysmal atrial fibrillation: Secondary | ICD-10-CM | POA: Diagnosis not present

## 2015-11-01 DIAGNOSIS — G4730 Sleep apnea, unspecified: Secondary | ICD-10-CM | POA: Diagnosis present

## 2015-11-01 DIAGNOSIS — I110 Hypertensive heart disease with heart failure: Secondary | ICD-10-CM | POA: Diagnosis present

## 2015-11-01 DIAGNOSIS — M797 Fibromyalgia: Secondary | ICD-10-CM | POA: Diagnosis present

## 2015-11-01 DIAGNOSIS — L97809 Non-pressure chronic ulcer of other part of unspecified lower leg with unspecified severity: Secondary | ICD-10-CM | POA: Diagnosis present

## 2015-11-01 LAB — CBC WITH DIFFERENTIAL/PLATELET
BASOS ABS: 0 10*3/uL (ref 0.0–0.1)
Basophils Relative: 0 %
EOS ABS: 0 10*3/uL (ref 0.0–0.7)
EOS PCT: 0 %
HCT: 49 % — ABNORMAL HIGH (ref 36.0–46.0)
Hemoglobin: 14.5 g/dL (ref 12.0–15.0)
Lymphocytes Relative: 17 %
Lymphs Abs: 1.7 10*3/uL (ref 0.7–4.0)
MCH: 28.7 pg (ref 26.0–34.0)
MCHC: 29.6 g/dL — ABNORMAL LOW (ref 30.0–36.0)
MCV: 97 fL (ref 78.0–100.0)
MONO ABS: 0.8 10*3/uL (ref 0.1–1.0)
Monocytes Relative: 8 %
Neutro Abs: 7.1 10*3/uL (ref 1.7–7.7)
Neutrophils Relative %: 75 %
Platelets: 165 10*3/uL (ref 150–400)
RBC: 5.05 MIL/uL (ref 3.87–5.11)
RDW: 14 % (ref 11.5–15.5)
WBC: 9.6 10*3/uL (ref 4.0–10.5)

## 2015-11-01 LAB — COMPREHENSIVE METABOLIC PANEL
ALBUMIN: 3.4 g/dL — AB (ref 3.5–5.0)
ALK PHOS: 91 U/L (ref 38–126)
ALT: 12 U/L — AB (ref 14–54)
AST: 20 U/L (ref 15–41)
Anion gap: 8 (ref 5–15)
BUN: 18 mg/dL (ref 6–20)
CALCIUM: 9 mg/dL (ref 8.9–10.3)
CHLORIDE: 97 mmol/L — AB (ref 101–111)
CO2: 32 mmol/L (ref 22–32)
CREATININE: 0.81 mg/dL (ref 0.44–1.00)
GFR calc Af Amer: >60 mL/min (ref >60)
GFR calc non Af Amer: >60 mL/min (ref >60)
GLUCOSE: 109 mg/dL — AB (ref 65–99)
Potassium: 3.7 mmol/L (ref 3.5–5.1)
SODIUM: 137 mmol/L (ref 135–145)
Total Bilirubin: 0.8 mg/dL (ref 0.3–1.2)
Total Protein: 6.9 g/dL (ref 6.5–8.1)

## 2015-11-01 LAB — I-STAT CG4 LACTIC ACID, ED: Lactic Acid, Venous: 1.32 mmol/L (ref 0.5–1.9)

## 2015-11-01 MED ORDER — VANCOMYCIN HCL 10 G IV SOLR
1250.0000 mg | INTRAVENOUS | Status: DC
  Administered 2015-11-02: 1250 mg via INTRAVENOUS
  Filled 2015-11-01 (×2): qty 1250

## 2015-11-01 MED ORDER — SODIUM CHLORIDE 0.9 % IV BOLUS (SEPSIS)
1000.0000 mL | Freq: Once | INTRAVENOUS | Status: AC
  Administered 2015-11-01: 1000 mL via INTRAVENOUS

## 2015-11-01 MED ORDER — ACETAMINOPHEN 325 MG PO TABS
650.0000 mg | ORAL_TABLET | Freq: Four times a day (QID) | ORAL | Status: DC | PRN
  Administered 2015-11-02 – 2015-11-04 (×7): 650 mg via ORAL
  Filled 2015-11-01 (×7): qty 2

## 2015-11-01 MED ORDER — VANCOMYCIN HCL 10 G IV SOLR
2000.0000 mg | Freq: Once | INTRAVENOUS | Status: AC
  Administered 2015-11-01: 2000 mg via INTRAVENOUS
  Filled 2015-11-01: qty 2000

## 2015-11-01 NOTE — H&P (Signed)
History and Physical    Misty Humanthel M Ashmore ZOX:[id number] DOB: 1941-02-08 DOA: 11/01/2015  PCP: Thane EduKOEHLER,ROBERT NICHOLAS, MD   Patient coming from: Rehabilitation facility  Chief Complaint: Worsening leg infection.  HPI: Misty Buckley is a 75 y.o. female with medical history significant of atrial fibrillation on Xarelto, benign essential tremor, cervical spondylosis, chronic venous insufficiency, cervical spine disc disease, fibromyalgia, hypertension, insomnia, morbid obesity, osteoarthritis who comes to the emergency department from her rehabilitation facility due to worsening of her right lower extremity cellulitis.  Per patient, about 30 days ago she developed edema, erythema, tenderness of her right lower extremity pretibial area after the appearance of a spontaneous oozing wound. She was given an oral antibiotic until about 2 weeks ago, but states that the wound did not heal. For the past 2 days, she has noticed recurrence of the symptoms, but denies fever or chills. She is states that the discharge output of the wound has increased.  ED Course: The patient received IV antibiotics. Workup so far has been unremarkable.  Review of Systems: As per HPI otherwise 10 point review of systems negative.    Past Medical History:  Diagnosis Date  . A-fib (HCC)   . Benign essential tremor   . Cervical spondylosis   . Chronic anticoagulation   . Chronic venous insufficiency   . DDD (degenerative disc disease), cervical   . Fibromyalgia   . Hypertension   . Insomnia   . Morbid obesity (HCC)   . Osteoarthritis   . Umbilical hernia     Past Surgical History:  Procedure Laterality Date  . COLONOSCOPY N/A 06/12/2015   Procedure: COLONOSCOPY;  Surgeon: Ruffin FrederickSteven Paul Armbruster, MD;  [facility];  Service: Gastroenterology;  Laterality: N/A;  . INGUINAL HERNIA REPAIR Right   . PARTIAL HYSTERECTOMY       reports that she has never smoked. She has never used smokeless tobacco. She  reports that she does not drink alcohol or use drugs.  Allergies  Allergen Reactions  . Hydromorphone Other (See Comments)    Decreased B/P & HR  . Morphine Other (See Comments)    Decreased B/P & HR  . Risperidone And Related Other (See Comments)    Mentally confused/ drooling  . Vancomycin Other (See Comments)    May have caused renal issues  . Zolpidem Other (See Comments)    Confusion,  Dizziness, and hallucinations    Family History  Problem Relation Age of Onset  . Heart attack Father [phone]    deceased  . Heart attack Brother   . Diabetes Mother      Prior to Admission medications   Medication Sig Start Date End Date Taking? Authorizing Provider  Calcium Citrate-Vitamin D (CITRACAL PETITES/VITAMIN D) 200-250 MG-UNIT TABS Take 1 tablet by mouth daily.    Historical Provider, MD  diclofenac sodium (VOLTAREN) 1 % GEL Apply up to 4 times daily as needed for neck or back pain 06/21/15   Selina CooleyKyle Flores, MD  DULoxetine (CYMBALTA) 30 MG capsule Take 30 mg by mouth at bedtime. Also take 60 mg tablet every morning    Historical Provider, MD  Emollient Hinton Dyer(LUBRIDERM) LOTN On bilateral lower extremities 06/21/15   Selina CooleyKyle Flores, MD  estradiol (ESTRACE) 0.1 MG/GM vaginal cream Place 1 Applicatorful vaginally at bedtime.    Historical Provider, MD  feeding supplement, ENSURE ENLIVE, (ENSURE ENLIVE) LIQD Take 237 mLs by mouth 2 (two) times daily between meals. 06/21/15   Selina CooleyKyle Flores, MD  furosemide (LASIX) 40 MG  tablet If weight increases to 255lbs, take 1 pill daily until weight drops to 250lbs 06/21/15   Selina Cooley, MD  hydrocortisone (ANUSOL-HC) 25 MG suppository Place 1 suppository (25 mg total) rectally 2 (two) times daily. 06/12/15   Selina Cooley, MD  Melatonin 5 MG TABS Take 5 mg by mouth at bedtime.     Historical Provider, MD  metoprolol succinate (TOPROL-XL) 50 MG 24 hr tablet Take 25 mg by mouth 2 (two) times daily. Take with or immediately following a meal.    Historical Provider, MD  Multiple  Vitamin (MULTIVITAMIN WITH MINERALS) TABS tablet Take 1 tablet by mouth daily.    Historical Provider, MD  OXYGEN Inhale 2 L into the lungs continuous.     Historical Provider, MD  rivaroxaban (XARELTO) 20 MG TABS tablet Take 20 mg by mouth daily with supper.    Historical Provider, MD  senna-docusate (SENNA-S) 8.6-50 MG tablet Take 1 tablet by mouth at bedtime as needed for mild constipation. 06/21/15   Selina Cooley, MD    Physical Exam: Vitals:   11/01/15 2100 11/01/15 2104 11/01/15 2130 11/01/15 2200  BP: (!) 111/45  (!) 133/43 124/75  Pulse: 94  97 104  Resp: 18     Temp:  99.4 F (37.4 C)    TempSrc: Oral Oral    SpO2: 92%  96% 93%  Weight:      Height:          Constitutional: NAD, calm, comfortable Vitals:   11/01/15 2100 11/01/15 2104 11/01/15 2130 11/01/15 2200  BP: (!) 111/45  (!) 133/43 124/75  Pulse: 94  97 104  Resp: 18     Temp:  99.4 F (37.4 C)    TempSrc: Oral Oral    SpO2: 92%  96% 93%  Weight:      Height:       Eyes: PERRL, lids and conjunctivae normal ENMT: Mucous membranes are moist. Posterior pharynx clear of any exudate or lesions.Normal dentition.  Neck: normal, supple, no masses, no thyromegaly Respiratory: clear to auscultation bilaterally, no wheezing, no crackles. Normal respiratory effort. No accessory muscle use.  Cardiovascular: Regular rate and rhythm, no murmurs / rubs / gallops. Trace pitting lower extremity edema. Positive lymphedema lower extremities. 2+ pedal pulses. No carotid bruits.  Abdomen: Bowel sounds positive. no tenderness, no masses palpated. No hepatosplenomegaly. Musculoskeletal: no clubbing / cyanosis. Good ROM, no contractures. Normal muscle tone.  Skin: Erythema on pretibial areas bilaterally, right > left. Positive pretibial tender, warm area  about 2 x 2 centimeters, cratered lesion with serous-purulent discharge. Neurologic: CN 2-12 grossly intact. Sensation intact, DTR normal. Strength 5/5 in all 4.  Psychiatric:  Normal judgment and insight. Alert and oriented x 4. Normal mood.      Labs on Admission: I have personally reviewed following labs and imaging studies  CBC:  Recent Labs Lab 11/01/15 2019  WBC 9.6  NEUTROABS 7.1  HGB 14.5  HCT 49.0*  MCV 97.0  PLT 165   Basic Metabolic Panel:  Recent Labs Lab 11/01/15 2019  NA 137  K 3.7  CL 97*  CO2 32  GLUCOSE 109*  BUN 18  CREATININE 0.81  CALCIUM 9.0   GFR: Estimated Creatinine Clearance: 75.9 mL/min (by C-G formula based on SCr of 0.81 mg/dL). Liver Function Tests:  Recent Labs Lab 11/01/15 2019  AST 20  ALT 12*  ALKPHOS 91  BILITOT 0.8  PROT 6.9  ALBUMIN 3.4*    Urine analysis:    Component  Value Date/Time   COLORURINE AMBER (A) 06/14/2015 1351   APPEARANCEUR CLOUDY (A) 06/14/2015 1351   LABSPEC 1.012 06/14/2015 1351   PHURINE 5.0 06/14/2015 1351   GLUCOSEU NEGATIVE 06/14/2015 1351   HGBUR TRACE (A) 06/14/2015 1351   BILIRUBINUR NEGATIVE 06/14/2015 1351   KETONESUR NEGATIVE 06/14/2015 1351   PROTEINUR NEGATIVE 06/14/2015 1351   NITRITE NEGATIVE 06/14/2015 1351   LEUKOCYTESUR NEGATIVE 06/14/2015 1351     Assessment/Plan Principal Problem:   Cellulitis of right lower extremity. Admit to MedSurg/inpatient. Continue IV antibiotics. Tylenol as needed for pain, since the patient's BP is very sensitive to narcotics use. Check arterial circulation in the morning.  Active Problems:   Essential hypertension, benign Continue metoprolol 50 mg by mouth daily. Continue furosemide 40 mg by mouth daily. Blood pressure monitoring.    Sleep apnea Not on CPAP. Supplemental oxygen at bedtime.    PAF (paroxysmal atrial fibrillation) (HCC) CHA2DS2-VASc Score of at least 3. Continue metoprolol for rate control. Continue on Xarelto 20 mg by mouth daily.    Constipation Continue stool softeners.    Insomnia Continue nightly melatonin as needed.    Hemorrhoids Continue Anusol HC suppositories.    DVT  prophylaxis: On Xarelto. Code Status: Full code. Family Communication:  Disposition Plan: Admit for IV antibiotic therapy for several days. Consults called:  Admission status: Inpatient/MedSurg.   Bobette Moavid Manuel Ortiz MD [facility] Pager [phone].  If 7PM-7AM, please contact night-coverage [email or web address] Password [email or web address]  11/01/2015, 10:30 PM

## 2015-11-01 NOTE — ED Provider Notes (Signed)
[facility]-EMERGENCY DEPT Provider Note   CSN: [id number] Arrival date & time: 11/01/15  1351     History   Chief Complaint Chief Complaint  Patient presents with  . Leg Swelling    HPI Misty Buckley is a 75 y.o. female.  HPI History of a-fib, chronic venous insufficiency, anticoagulation with xarelto for a-fib and history of DVT, prior left lower extremity cellulitis presents for RLE swelling.  She reports she was on an antibiotic for right leg infection and it was stopped 2 weeks ago.  She lives in a rehab facility.  She has had worsening swelling of the right leg, with clear drainage, and tenderness and warmth worsening since 2 weeks ago, mostly over the past week.  Denies fevers at home or SOB.    Past Medical History:  Diagnosis Date  . A-fib (HCC)   . Benign essential tremor   . Cervical spondylosis   . Chronic anticoagulation   . Chronic venous insufficiency   . DDD (degenerative disc disease), cervical   . Fibromyalgia   . Hypertension   . Insomnia   . Morbid obesity (HCC)   . Osteoarthritis   . Umbilical hernia     Patient Active Problem List   Diagnosis Date Noted  . OSA (obstructive sleep apnea)   . PAF (paroxysmal atrial fibrillation) (HCC)   . Chronic respiratory failure with hypoxia and hypercapnia (HCC)   . Dyspnea   . Left leg cellulitis   . CHF (congestive heart failure) (HCC) 06/14/2015  . Acute respiratory failure with hypoxia and hypercarbia (HCC) 06/14/2015  . Hemorrhoid   . Pressure ulcer 06/11/2015  . Rectal bleed 06/10/2015  . Rectal bleeding 06/10/2015  . Morbid obesity (HCC) 06/10/2015  . Atrial fibrillation (HCC) 06/10/2015  . Chronic venous insufficiency 06/10/2015  . Essential hypertension, benign 06/10/2015  . Sleep apnea 06/10/2015    Past Surgical History:  Procedure Laterality Date  . COLONOSCOPY N/A 06/12/2015   Procedure: COLONOSCOPY;  Surgeon: Ruffin Frederick, MD;  Location[facility] ENDOSCOPY;  Service: Gastroenterology;   Laterality: N/A;  . INGUINAL HERNIA REPAIR Right   . PARTIAL HYSTERECTOMY      OB History    No data available       Home Medications    Prior to Admission medications   Medication Sig Start Date End Date Taking? Authorizing Provider  Calcium Citrate-Vitamin D (CITRACAL PETITES/VITAMIN D) 200-250 MG-UNIT TABS Take 1 tablet by mouth daily.    Historical Provider, MD  diclofenac sodium (VOLTAREN) 1 % GEL Apply up to 4 times daily as needed for neck or back pain 06/21/15   Selina Cooley, MD  DULoxetine (CYMBALTA) 30 MG capsule Take 30 mg by mouth at bedtime. Also take 60 mg tablet every morning    Historical Provider, MD  Emollient Hinton Dyer) LOTN On bilateral lower extremities 06/21/15   Selina Cooley, MD  estradiol (ESTRACE) 0.1 MG/GM vaginal cream Place 1 Applicatorful vaginally at bedtime.    Historical Provider, MD  feeding supplement, ENSURE ENLIVE, (ENSURE ENLIVE) LIQD Take 237 mLs by mouth 2 (two) times daily between meals. 06/21/15   Selina Cooley, MD  furosemide (LASIX) 40 MG tablet If weight increases to 255lbs, take 1 pill daily until weight drops to 250lbs 06/21/15   Selina Cooley, MD  hydrocortisone (ANUSOL-HC) 25 MG suppository Place 1 suppository (25 mg total) rectally 2 (two) times daily. 06/12/15   Selina Cooley, MD  Melatonin 5 MG TABS Take 5 mg by mouth at bedtime.  Historical Provider, MD  metoprolol succinate (TOPROL-XL) 50 MG 24 hr tablet Take 25 mg by mouth 2 (two) times daily. Take with or immediately following a meal.    Historical Provider, MD  Multiple Vitamin (MULTIVITAMIN WITH MINERALS) TABS tablet Take 1 tablet by mouth daily.    Historical Provider, MD  OXYGEN Inhale 2 L into the lungs continuous.     Historical Provider, MD  rivaroxaban (XARELTO) 20 MG TABS tablet Take 20 mg by mouth daily with supper.    Historical Provider, MD  senna-docusate (SENNA-S) 8.6-50 MG tablet Take 1 tablet by mouth at bedtime as needed for mild constipation. 06/21/15   Selina CooleyKyle Flores, MD     Family History Family History  Problem Relation Age of Onset  . Heart attack Father 257    deceased  . Heart attack Brother   . Diabetes Mother     Social History Social History  Substance Use Topics  . Smoking status: Never Smoker  . Smokeless tobacco: Never Used  . Alcohol use No     Allergies   Hydromorphone; Morphine; Risperidone and related; Vancomycin; and Zolpidem   Review of Systems Review of Systems  Constitutional: Negative for chills and fever.  HENT: Negative for ear pain and sore throat.   Eyes: Negative for pain and visual disturbance.  Respiratory: Negative for cough and shortness of breath.   Cardiovascular: Negative for chest pain and palpitations.  Gastrointestinal: Negative for abdominal pain and vomiting.  Genitourinary: Negative for dysuria and hematuria.  Musculoskeletal: Positive for myalgias. Negative for arthralgias and back pain.  Skin: Positive for rash and wound. Negative for color change.  Neurological: Negative for seizures and syncope.  All other systems reviewed and are negative.    Physical Exam Updated Vital Signs BP (!) 126/54   Pulse 88   Temp 98.4 F (36.9 C) (Oral)   Resp 18   Ht 5\' 4"  (1.626 m)   Wt 115.2 kg   SpO2 97%   BMI 43.60 kg/m   Physical Exam  Constitutional: She appears well-developed and well-nourished. No distress.  HENT:  Head: Normocephalic and atraumatic.  Eyes: Conjunctivae are normal.  Neck: Neck supple.  Cardiovascular: Normal rate and regular rhythm.   No murmur heard. Pulmonary/Chest: Effort normal and breath sounds normal. No respiratory distress.  Abdominal: Soft. There is no tenderness.  Musculoskeletal: She exhibits no edema.  Bilateral lower legs are red and edematous.  The right lower leg is edematous, red, warm, tender, with a 2x2cm depressed wound with central purulence and serous drainage.  She has full strength bilaterally, and intact DP pulses.    Neurological: She is alert.   Skin: Skin is warm and dry.  Psychiatric: She has a normal mood and affect.  Nursing note and vitals reviewed.    ED Treatments / Results  Labs (all labs ordered are listed, but only abnormal results are displayed) Labs Reviewed  CBC WITH DIFFERENTIAL/PLATELET  COMPREHENSIVE METABOLIC PANEL  I-STAT CG4 LACTIC ACID, ED    EKG  EKG Interpretation None       Radiology No results found.  Procedures Procedures (including critical care time)  Medications Ordered in ED Medications - No data to display   Initial Impression / Assessment and Plan / ED Course  I have reviewed the triage vital signs and the nursing notes.  Pertinent labs & imaging results that were available during my care of the patient were reviewed by me and considered in my medical decision making (see chart  for details).  Clinical Course    Patient with RLE swelling consistent with cellulitis that has failed outpatient antibiotics. Could not r/o DVT (US unavailable to perform tonight), however patient is already anticoagulated.  Patient has adequate DP pulses bilaterally.  No signs of systemic toxicity.  Abx ordered.  Patient admitted to hospitalist.  Final Clinical Impressions(s) / ED Diagnoses   Final diagnoses:  None    New Prescriptions New Prescriptions   No medications on file     Marcelina MorelMichael Shamir Sedlar, MD 11/01/15 2325    Charlynne Panderavid Hsienta Yao, MD 11/05/15 2115

## 2015-11-01 NOTE — ED Triage Notes (Signed)
Pt has sore that is oozing clear fluid on anterior right lower leg that started 30 days ago.  Pt has been seen for same and took course of antibiotics.  Very painful.

## 2015-11-01 NOTE — Progress Notes (Signed)
Pharmacy Antibiotic Note  Misty Buckley is a 75 y.o. female admitted on 11/01/2015 with weeping cellulitis on R lower leg. Scr 1.8, LA 1.3, eCrCl ~ 50-60 ml/min.  Plan: -Vancomycin 2g IV x1 then 1250 mg IV q24h -Monitor renal fx, cultures, obtain VT at Css   Height: 5\' 4"  (162.6 cm) Weight: 254 lb (115.2 kg) IBW/kg (Calculated) : 54.7  Temp (24hrs), Avg:98.7 F (37.1 C), Min:98.3 F (36.8 C), Max:99.4 F (37.4 C)   Recent Labs Lab 11/01/15 2019 11/01/15 2033  WBC 9.6  --   CREATININE 0.81  --   LATICACIDVEN  --  1.32    Estimated Creatinine Clearance: 75.9 mL/min (by C-G formula based on SCr of 0.81 mg/dL).    Allergies  Allergen Reactions  . Hydromorphone Other (See Comments)    Decreased B/P & HR  . Morphine Other (See Comments)    Decreased B/P & HR  . Risperidone And Related Other (See Comments)    Mentally confused/ drooling  . Vancomycin Other (See Comments)    May have caused renal issues  . Zolpidem Other (See Comments)    Confusion,  Dizziness, and hallucinations    Antimicrobials this admission: 8/22 vancomycin >>  Dose adjustments this admission: NA  Microbiology results: NA  Thank you for allowing pharmacy to be a part of this patient's care.  Baldemar FridayMasters, Shaeley Segall M 11/01/2015 9:26 PM

## 2015-11-02 ENCOUNTER — Inpatient Hospital Stay (HOSPITAL_COMMUNITY): Payer: Medicare (Managed Care)

## 2015-11-02 DIAGNOSIS — L03115 Cellulitis of right lower limb: Principal | ICD-10-CM

## 2015-11-02 DIAGNOSIS — K5901 Slow transit constipation: Secondary | ICD-10-CM

## 2015-11-02 DIAGNOSIS — I10 Essential (primary) hypertension: Secondary | ICD-10-CM

## 2015-11-02 DIAGNOSIS — I480 Paroxysmal atrial fibrillation: Secondary | ICD-10-CM

## 2015-11-02 DIAGNOSIS — G4730 Sleep apnea, unspecified: Secondary | ICD-10-CM

## 2015-11-02 LAB — COMPREHENSIVE METABOLIC PANEL
ALT: 8 U/L — AB (ref 14–54)
AST: 19 U/L (ref 15–41)
Albumin: 2.8 g/dL — ABNORMAL LOW (ref 3.5–5.0)
Alkaline Phosphatase: 79 U/L (ref 38–126)
Anion gap: 6 (ref 5–15)
BUN: 17 mg/dL (ref 6–20)
CHLORIDE: 104 mmol/L (ref 101–111)
CO2: 34 mmol/L — AB (ref 22–32)
CREATININE: 0.84 mg/dL (ref 0.44–1.00)
Calcium: 8.1 mg/dL — ABNORMAL LOW (ref 8.9–10.3)
Glucose, Bld: 122 mg/dL — ABNORMAL HIGH (ref 65–99)
POTASSIUM: 4 mmol/L (ref 3.5–5.1)
SODIUM: 144 mmol/L (ref 135–145)
TOTAL PROTEIN: 5.7 g/dL — AB (ref 6.5–8.1)
Total Bilirubin: 0.6 mg/dL (ref 0.3–1.2)

## 2015-11-02 LAB — CBC WITH DIFFERENTIAL/PLATELET
BASOS PCT: 0 %
Basophils Absolute: 0 10*3/uL (ref 0.0–0.1)
EOS ABS: 0 10*3/uL (ref 0.0–0.7)
EOS PCT: 1 %
HCT: 44.5 % (ref 36.0–46.0)
HEMOGLOBIN: 12.9 g/dL (ref 12.0–15.0)
Lymphocytes Relative: 19 %
Lymphs Abs: 1.7 10*3/uL (ref 0.7–4.0)
MCH: 28.7 pg (ref 26.0–34.0)
MCHC: 29 g/dL — AB (ref 30.0–36.0)
MCV: 98.9 fL (ref 78.0–100.0)
Monocytes Absolute: 0.9 10*3/uL (ref 0.1–1.0)
Monocytes Relative: 11 %
NEUTROS PCT: 69 %
Neutro Abs: 6.1 10*3/uL (ref 1.7–7.7)
PLATELETS: 166 10*3/uL (ref 150–400)
RBC: 4.5 MIL/uL (ref 3.87–5.11)
RDW: 14.1 % (ref 11.5–15.5)
WBC: 8.7 10*3/uL (ref 4.0–10.5)

## 2015-11-02 LAB — MRSA PCR SCREENING: MRSA BY PCR: NEGATIVE

## 2015-11-02 MED ORDER — FUROSEMIDE 40 MG PO TABS
40.0000 mg | ORAL_TABLET | Freq: Every day | ORAL | Status: DC
  Administered 2015-11-02: 40 mg via ORAL
  Filled 2015-11-02 (×2): qty 1

## 2015-11-02 MED ORDER — MELATONIN 3 MG PO TABS
4.5000 mg | ORAL_TABLET | Freq: Every day | ORAL | Status: DC
  Administered 2015-11-02 – 2015-11-03 (×2): 4.5 mg via ORAL
  Filled 2015-11-02 (×4): qty 1.5

## 2015-11-02 MED ORDER — ONDANSETRON HCL 4 MG/2ML IJ SOLN: 4.0000 mg | Freq: Four times a day (QID) | INTRAMUSCULAR | Status: DC | PRN

## 2015-11-02 MED ORDER — CALCIUM CARBONATE-VITAMIN D 500-200 MG-UNIT PO TABS
1.0000 | ORAL_TABLET | Freq: Every day | ORAL | Status: DC
  Administered 2015-11-02 – 2015-11-04 (×3): 1 via ORAL
  Filled 2015-11-02 (×3): qty 1

## 2015-11-02 MED ORDER — HYDROCORTISONE 1 % EX CREA
TOPICAL_CREAM | Freq: Four times a day (QID) | CUTANEOUS | Status: DC | PRN
  Administered 2015-11-03 – 2015-11-04 (×2): via TOPICAL
  Filled 2015-11-02: qty 28

## 2015-11-02 MED ORDER — SENNOSIDES-DOCUSATE SODIUM 8.6-50 MG PO TABS
1.0000 | ORAL_TABLET | Freq: Every evening | ORAL | Status: DC | PRN
  Administered 2015-11-03: 1 via ORAL
  Filled 2015-11-02: qty 1

## 2015-11-02 MED ORDER — HYDROCORTISONE ACETATE 25 MG RE SUPP
25.0000 mg | Freq: Two times a day (BID) | RECTAL | Status: DC
  Filled 2015-11-02 (×8): qty 1

## 2015-11-02 MED ORDER — METOPROLOL SUCCINATE ER 25 MG PO TB24
25.0000 mg | ORAL_TABLET | Freq: Two times a day (BID) | ORAL | Status: DC
  Administered 2015-11-02 – 2015-11-03 (×5): 25 mg via ORAL
  Filled 2015-11-02 (×5): qty 1

## 2015-11-02 MED ORDER — RIVAROXABAN 20 MG PO TABS
20.0000 mg | ORAL_TABLET | Freq: Every day | ORAL | Status: DC
  Administered 2015-11-02 – 2015-11-03 (×2): 20 mg via ORAL
  Filled 2015-11-02 (×2): qty 1

## 2015-11-02 MED ORDER — ADULT MULTIVITAMIN W/MINERALS CH
1.0000 | ORAL_TABLET | Freq: Every day | ORAL | Status: DC
  Administered 2015-11-02 – 2015-11-04 (×3): 1 via ORAL
  Filled 2015-11-02 (×3): qty 1

## 2015-11-02 MED ORDER — DULOXETINE HCL 30 MG PO CPEP
30.0000 mg | ORAL_CAPSULE | Freq: Every day | ORAL | Status: DC
  Administered 2015-11-02 – 2015-11-03 (×3): 30 mg via ORAL
  Filled 2015-11-02 (×3): qty 1

## 2015-11-02 MED ORDER — ONDANSETRON HCL 4 MG PO TABS: 4.0000 mg | ORAL_TABLET | Freq: Four times a day (QID) | ORAL | Status: DC | PRN

## 2015-11-02 MED ORDER — PIPERACILLIN-TAZOBACTAM 3.375 G IVPB
3.3750 g | Freq: Three times a day (TID) | INTRAVENOUS | Status: DC
  Administered 2015-11-02 – 2015-11-04 (×7): 3.375 g via INTRAVENOUS
  Filled 2015-11-02 (×12): qty 50

## 2015-11-02 MED ORDER — CALCIUM CITRATE-VITAMIN D 200-250 MG-UNIT PO TABS: 1.0000 | ORAL_TABLET | Freq: Every day | ORAL | Status: DC

## 2015-11-02 MED ORDER — DULOXETINE HCL 60 MG PO CPEP
60.0000 mg | ORAL_CAPSULE | Freq: Every day | ORAL | Status: DC
  Administered 2015-11-02 – 2015-11-04 (×3): 60 mg via ORAL
  Filled 2015-11-02 (×3): qty 1

## 2015-11-02 MED ORDER — GADOBENATE DIMEGLUMINE 529 MG/ML IV SOLN
20.0000 mL | Freq: Once | INTRAVENOUS | Status: AC | PRN
  Administered 2015-11-02: 20 mL via INTRAVENOUS

## 2015-11-02 MED ORDER — ESTRADIOL 0.1 MG/GM VA CREA
1.0000 | TOPICAL_CREAM | Freq: Every day | VAGINAL | Status: DC
  Administered 2015-11-02 – 2015-11-03 (×2): 1 via VAGINAL
  Filled 2015-11-02 (×2): qty 42.5

## 2015-11-02 MED ORDER — ENSURE ENLIVE PO LIQD
237.0000 mL | Freq: Two times a day (BID) | ORAL | Status: DC
  Administered 2015-11-02 – 2015-11-04 (×5): 237 mL via ORAL

## 2015-11-02 NOTE — Discharge Instructions (Addendum)
Cellulitis Cellulitis is an infection of the skin and the tissue beneath it. The infected area is usually red and tender. Cellulitis occurs most often in the arms and lower legs.  CAUSES  Cellulitis is caused by bacteria that enter the skin through cracks or cuts in the skin. The most common types of bacteria that cause cellulitis are staphylococci and streptococci. SIGNS AND SYMPTOMS   Redness and warmth.  Swelling.  Tenderness or pain.  Fever. DIAGNOSIS  Your health care provider can usually determine what is wrong based on a physical exam. Blood tests may also be done. TREATMENT  Treatment usually involves taking an antibiotic medicine. HOME CARE INSTRUCTIONS   Take your antibiotic medicine as directed by your health care provider. Finish the antibiotic even if you start to feel better.  Keep the infected arm or leg elevated to reduce swelling.  Apply a warm cloth to the affected area up to 4 times per day to relieve pain.  Take medicines only as directed by your health care provider.  Keep all follow-up visits as directed by your health care provider. SEEK MEDICAL CARE IF:   You notice red streaks coming from the infected area.  Your red area gets larger or turns dark in color.  Your bone or joint underneath the infected area becomes painful after the skin has healed.  Your infection returns in the same area or another area.  You notice a swollen bump in the infected area.  You develop new symptoms.  You have a fever. SEEK IMMEDIATE MEDICAL CARE IF:   You feel very sleepy.  You develop vomiting or diarrhea.  You have a general ill feeling (malaise) with muscle aches and pains.   This information is not intended to replace advice given to you by your health care provider. Make sure you discuss any questions you have with your health care provider.   Document Released: 12/06/2004 Document Revised: 11/17/2014 Document Reviewed: 05/14/2011 Elsevier  Interactive Patient Education 2016 [employer] Information on my medicine - XARELTO (Rivaroxaban)  Why was Xarelto prescribed for you? Xarelto was prescribed for you to reduce the risk of a blood clot forming that can cause a stroke if you have a medical condition called atrial fibrillation (a type of irregular heartbeat).  What do you need to know about xarelto ? Take your Xarelto ONCE DAILY at the same time every day with your evening meal. If you have difficulty swallowing the tablet whole, you may crush it and mix in applesauce just prior to taking your dose.  Take Xarelto exactly as prescribed by your doctor and DO NOT stop taking Xarelto without talking to the doctor who prescribed the medication.  Stopping without other stroke prevention medication to take the place of Xarelto may increase your risk of developing a clot that causes a stroke.  Refill your prescription before you run out.  After discharge, you should have regular check-up appointments with your healthcare provider that is prescribing your Xarelto.  In the future your dose may need to be changed if your kidney function or weight changes by a significant amount.  What do you do if you miss a dose? If you are taking Xarelto ONCE DAILY and you miss a dose, take it as soon as you remember on the same day then continue your regularly scheduled once daily regimen the next day. Do not take two doses of Xarelto at the same time or on the same day.   Important Safety Information A  possible side effect of Xarelto is bleeding. You should call your healthcare provider right away if you experience any of the following: ? Bleeding from an injury or your nose that does not stop. ? Unusual colored urine (red or dark brown) or unusual colored stools (red or black). ? Unusual bruising for unknown reasons. ? A serious fall or if you hit your head (even if there is no bleeding).  Some medicines may interact with Xarelto and  might increase your risk of bleeding while on Xarelto. To help avoid this, consult your healthcare provider or pharmacist prior to using any new prescription or non-prescription medications, including herbals, vitamins, non-steroidal anti-inflammatory drugs (NSAIDs) and supplements.  This website has more information on Xarelto: [email or web address].xarelto.[email or web address].

## 2015-11-02 NOTE — NC FL2 (Signed)
[place] MEDICAID FL2 LEVEL OF CARE SCREENING TOOL     IDENTIFICATION  Patient Name: Misty Buckley Birthdate: Aug 11, 1940 Sex: female Admission Date (Current Location): 11/01/2015  [facility] [place]   Facility and Address:  [facility], [address], [place], [place] [address]      Provider Number: [id number]  Attending Physician Name and Address:  Renae FickleMackenzie Short, MD  Relative Name and Phone Number:       Current Level of Care: Hospital Recommended Level of Care: Skilled Nursing Facility Prior Approval Number:    Date Approved/Denied:   PASRR Number:  [id number])  Discharge Plan: SNF    Current Diagnoses: Patient Active Problem List   Diagnosis Date Noted  . Cellulitis of right lower extremity 11/01/2015  . Constipation 11/01/2015  . Insomnia 11/01/2015  . OSA (obstructive sleep apnea)   . PAF (paroxysmal atrial fibrillation) (HCC)   . Chronic respiratory failure with hypoxia and hypercapnia (HCC)   . Dyspnea   . Left leg cellulitis   . CHF (congestive heart failure) (HCC) 06/14/2015  . Acute respiratory failure with hypoxia and hypercarbia (HCC) 06/14/2015  . Hemorrhoid   . Pressure ulcer 06/11/2015  . Rectal bleed 06/10/2015  . Rectal bleeding 06/10/2015  . Morbid obesity (HCC) 06/10/2015  . Atrial fibrillation (HCC) 06/10/2015  . Chronic venous insufficiency 06/10/2015  . Essential hypertension, benign 06/10/2015  . Sleep apnea 06/10/2015    Orientation RESPIRATION BLADDER Height & Weight     Self, Time, Situation, Place  O2 Continent Weight: 254 lb (115.2 kg) Height:  5\' 4"  (162.6 cm)  BEHAVIORAL SYMPTOMS/MOOD NEUROLOGICAL BOWEL NUTRITION STATUS      Continent    AMBULATORY STATUS COMMUNICATION OF NEEDS Skin   Extensive Assist Verbally Normal                       Personal Care Assistance Level of Assistance  Bathing, Dressing Bathing Assistance: Limited assistance   Dressing Assistance:  Limited assistance     Functional Limitations Info             SPECIAL CARE FACTORS FREQUENCY                       Contractures      Additional Factors Info                  Current Medications (11/02/2015):  This is the current hospital active medication list Current Facility-Administered Medications  Medication Dose Route Frequency Provider Last Rate Last Dose  . acetaminophen (TYLENOL) tablet 650 mg  650 mg Oral Q6H PRN Bobette Moavid Manuel Ortiz, MD   650 mg at 11/02/15 0944  . calcium-vitamin D (OSCAL WITH D) 500-200 MG-UNIT per tablet 1 tablet  1 tablet Oral Q breakfast Bobette Moavid Manuel Ortiz, MD   1 tablet at 11/02/15 1143  . DULoxetine (CYMBALTA) DR capsule 30 mg  30 mg Oral QHS Bobette Moavid Manuel Ortiz, MD   30 mg at 11/02/15 0135  . DULoxetine (CYMBALTA) DR capsule 60 mg  60 mg Oral Daily Bobette Moavid Manuel Ortiz, MD   60 mg at 11/02/15 1143  . estradiol (ESTRACE) vaginal cream 1 Applicatorful  1 Applicatorful Vaginal QHS Bobette Moavid Manuel Ortiz, MD      . feeding supplement (ENSURE ENLIVE) (ENSURE ENLIVE) liquid 237 mL  237 mL Oral BID BM Bobette Moavid Manuel Ortiz, MD   237 mL at 11/02/15 1440  . furosemide (LASIX)  tablet 40 mg  40 mg Oral Daily Bobette Moavid Manuel Ortiz, MD   40 mg at 11/02/15 1142  . hydrocortisone (ANUSOL-HC) suppository 25 mg  25 mg Rectal BID Bobette Moavid Manuel Ortiz, MD      . Melatonin TABS 4.5 mg  4.5 mg Oral QHS Bobette Moavid Manuel Ortiz, MD      . metoprolol succinate (TOPROL-XL) 24 hr tablet 25 mg  25 mg Oral BID Bobette Moavid Manuel Ortiz, MD   25 mg at 11/02/15 1143  . multivitamin with minerals tablet 1 tablet  1 tablet Oral Daily Bobette Moavid Manuel Ortiz, MD   1 tablet at 11/02/15 1142  . ondansetron (ZOFRAN) tablet 4 mg  4 mg Oral Q6H PRN Bobette Moavid Manuel Ortiz, MD       Or  . ondansetron [facility](ZOFRAN) injection 4 mg  4 mg Intravenous Q6H PRN Bobette Moavid Manuel Ortiz, MD      . piperacillin-tazobactam (ZOSYN) IVPB 3.375 g  3.375 g Intravenous Q8H Bobette Moavid Manuel Ortiz, MD   3.375 g at 11/02/15 1440  .  rivaroxaban (XARELTO) tablet 20 mg  20 mg Oral Q supper Bobette Moavid Manuel Ortiz, MD      . senna-docusate (Senokot-S) tablet 1 tablet  1 tablet Oral QHS PRN Bobette Moavid Manuel Ortiz, MD      . vancomycin [facility](VANCOCIN) 1,250 mg in sodium chloride 0.9 % 250 mL IVPB  1,250 mg Intravenous Q24H Darl HouseholderAlison M Masters, [facility] HospitalRPH         Discharge Medications: Please see discharge summary for a list of discharge medications.  Relevant Imaging Results:  Relevant Lab Results:   Additional Information  (ss: [id number])  Alene MiresWhitaker, Riona Lahti R

## 2015-11-02 NOTE — Progress Notes (Signed)
PROGRESS NOTE  Misty Humanthel M [place]  XBJ:[id number] DOB: November 14, 1940 DOA: 11/01/2015 PCP: Thane EduKOEHLER,ROBERT NICHOLAS, MD  Brief Narrative:   Misty Buckley is a 75 y.o. female with medical history significant of atrial fibrillation on Xarelto, benign essential tremor, cervical spondylosis, chronic venous insufficiency, cervical spine disc disease, fibromyalgia, hypertension, insomnia, morbid obesity, osteoarthritis who comes to the emergency department from her rehabilitation facility due to worsening of her right lower extremity cellulitis.  Per patient, about 30 days ago she developed edema, erythema, tenderness of her right lower extremity pretibial area after the appearance of a spontaneous oozing wound. She was given an oral antibiotic until about 2 weeks ago, but the wound did not heal.  For 2 days prior to admission, she had noticed recurrence of the symptoms, but denies fever or chills.    Assessment & Plan:   Principal Problem:   Cellulitis of right lower extremity Active Problems:   Essential hypertension, benign   Sleep apnea   PAF (paroxysmal atrial fibrillation) (HCC)   Constipation   Insomnia    Cellulitis of right lower extremity with draining ulcer of the anterior right shin.   - Continue IV vancomycin and Zosyn. Tylenol as needed for pain, since the patient's BP is very sensitive to narcotics use. - MRI of the right lower extremity - Pulses are normal, doubt arteriovascular disease -  Given the location of the ulcer, suspect venous insufficiency - Increase drainage may also be secondary to increased edema -  Elevate legs -  Consider increasing lasix if creatinine remains stable -  May benefit from una boots if ABIs are normal  Active Problems:   Essential hypertension, benign Continue metoprolol 50 mg by mouth daily. Continue furosemide 40 mg by mouth daily.    Sleep apnea Not on CPAP. Supplemental oxygen at bedtime.    PAF (paroxysmal atrial fibrillation)  (HCC) CHA2DS2-VASc Score of at least 3. Continue metoprolol for rate control. Continue on Xarelto 20 mg by mouth daily.    Constipation Continue stool softeners.    Insomnia Continue nightly melatonin as needed.    Hemorrhoids Continue Anusol HC suppositories.   DVT prophylaxis:  Xarelto Code Status:  full Family Communication:  Patient alone Disposition Plan:  Pending further evaluation of ulcer with MRI and antibiotics   Consultants:   none  Procedures:  none  Antimicrobials:   Vancomycin   Zosyn    Subjective: Ongoing redness, swelling, pain, and drainage from the right leg  Objective: Vitals:   11/01/15 2315 11/02/15 0030 11/02/15 0500 11/02/15 1300  BP: (!) 123/49 (!) 99/37 (!) 116/54 (!) 130/58  Pulse:  95 94 86  Resp:  18 20 20   Temp:  98.9 F (37.2 C) 97.5 F (36.4 C) 98.2 F (36.8 C)  TempSrc:  Oral Oral Oral  SpO2:  97% 99% 95%  Weight:      Height:  5\' 4"  (1.626 m)      Intake/Output Summary (Last 24 hours) at 11/02/15 1610 Last data filed at 11/02/15 0034  Gross per 24 hour  Intake                0 ml  Output              250 ml  Net             -250 ml   Filed Weights   11/01/15 1400  Weight: 115.2 kg (254 lb)    Examination:  General exam:  Adult female.  No  acute distress.  HEENT:  NCAT, MMM Respiratory system: Clear to auscultation bilaterally Cardiovascular system: Regular rate and rhythm, normal S1/S2. No murmurs, rubs, gallops or clicks.  Warm extremities Gastrointestinal system: Normal active bowel sounds, soft, nondistended, nontender. MSK:  Normal tone and bulk, 2+ pitting bilateral lower extremity edema with erythema of the bilateral shins.  The right shin has a depression with a central ulcer that is draining serous fluid.  Skin surrounding the ulcer and on the lateral shin is smooth and fragile.   Neuro:  Grossly intact    Data Reviewed: I have personally reviewed following labs and imaging  studies  CBC:  Recent Labs Lab 11/01/15 2019 11/02/15 0345  WBC 9.6 8.7  NEUTROABS 7.1 6.1  HGB 14.5 12.9  HCT 49.0* 44.5  MCV 97.0 98.9  PLT 165 166   Basic Metabolic Panel:  Recent Labs Lab 11/01/15 2019 11/02/15 0345  NA 137 144  K 3.7 4.0  CL 97* 104  CO2 32 34*  GLUCOSE 109* 122*  BUN 18 17  CREATININE 0.81 0.84  CALCIUM 9.0 8.1*   GFR: Estimated Creatinine Clearance: 73.2 mL/min (by C-G formula based on SCr of 0.84 mg/dL). Liver Function Tests:  Recent Labs Lab 11/01/15 2019 11/02/15 0345  AST 20 19  ALT 12* 8*  ALKPHOS 91 79  BILITOT 0.8 0.6  PROT 6.9 5.7*  ALBUMIN 3.4* 2.8*   No results for input(s): LIPASE, AMYLASE in the last 168 hours. No results for input(s): AMMONIA in the last 168 hours. Coagulation Profile: No results for input(s): INR, PROTIME in the last 168 hours. Cardiac Enzymes: No results for input(s): CKTOTAL, CKMB, CKMBINDEX, TROPONINI in the last 168 hours. BNP (last 3 results) No results for input(s): PROBNP in the last 8760 hours. HbA1C: No results for input(s): HGBA1C in the last 72 hours. CBG: No results for input(s): GLUCAP in the last 168 hours. Lipid Profile: No results for input(s): CHOL, HDL, LDLCALC, TRIG, CHOLHDL, LDLDIRECT in the last 72 hours. Thyroid Function Tests: No results for input(s): TSH, T4TOTAL, FREET4, T3FREE, THYROIDAB in the last 72 hours. Anemia Panel: No results for input(s): VITAMINB12, FOLATE, FERRITIN, TIBC, IRON, RETICCTPCT in the last 72 hours. Urine analysis:    Component Value Date/Time   COLORURINE AMBER (A) 06/14/2015 1351   APPEARANCEUR CLOUDY (A) 06/14/2015 1351   LABSPEC 1.012 06/14/2015 1351   PHURINE 5.0 06/14/2015 1351   GLUCOSEU NEGATIVE 06/14/2015 1351   HGBUR TRACE (A) 06/14/2015 1351   BILIRUBINUR NEGATIVE 06/14/2015 1351   KETONESUR NEGATIVE 06/14/2015 1351   PROTEINUR NEGATIVE 06/14/2015 1351   NITRITE NEGATIVE 06/14/2015 1351   LEUKOCYTESUR NEGATIVE 06/14/2015 1351    Sepsis Labs: @LABRCNTIP (procalcitonin:4,lacticidven:4)  ) Recent Results (from the past 240 hour(s))  MRSA PCR Screening     Status: None   Collection Time: 11/02/15 12:27 AM  Result Value Ref Range Status   MRSA by PCR NEGATIVE NEGATIVE Final    Comment:        The GeneXpert MRSA Assay (FDA approved for NASAL specimens only), is one component of a comprehensive MRSA colonization surveillance program. It is not intended to diagnose MRSA infection nor to guide or monitor treatment for MRSA infections.       Radiology Studies: No results found.   Scheduled Meds: . calcium-vitamin D  1 tablet Oral Q breakfast  . DULoxetine  30 mg Oral QHS  . DULoxetine  60 mg Oral Daily  . estradiol  1 Applicatorful Vaginal QHS  . feeding supplement (  ENSURE ENLIVE)  237 mL Oral BID BM  . furosemide  40 mg Oral Daily  . hydrocortisone  25 mg Rectal BID  . Melatonin  4.5 mg Oral QHS  . metoprolol succinate  25 mg Oral BID  . multivitamin with minerals  1 tablet Oral Daily  . piperacillin-tazobactam (ZOSYN)  IV  3.375 g Intravenous Q8H  . rivaroxaban  20 mg Oral Q supper  . vancomycin  1,250 mg Intravenous Q24H   Continuous Infusions:    LOS: 1 day    Time spent: 30 min    Renae FickleSHORT, Latiqua Daloia, MD [facility] Pager [phone]  If 7PM-7AM, please contact night-coverage [email or web address] Password [email or web address] 11/02/2015, 4:10 PM

## 2015-11-03 ENCOUNTER — Encounter (HOSPITAL_COMMUNITY): Payer: Medicare (Managed Care)

## 2015-11-03 LAB — CBC
HEMATOCRIT: 43.7 % (ref 36.0–46.0)
HEMOGLOBIN: 12.1 g/dL (ref 12.0–15.0)
MCH: 28.1 pg (ref 26.0–34.0)
MCHC: 27.7 g/dL — ABNORMAL LOW (ref 30.0–36.0)
MCV: 101.6 fL — AB (ref 78.0–100.0)
Platelets: 151 10*3/uL (ref 150–400)
RBC: 4.3 MIL/uL (ref 3.87–5.11)
RDW: 14.2 % (ref 11.5–15.5)
WBC: 6.8 10*3/uL (ref 4.0–10.5)

## 2015-11-03 LAB — BASIC METABOLIC PANEL
ANION GAP: 3 — AB (ref 5–15)
BUN: 13 mg/dL (ref 6–20)
CHLORIDE: 103 mmol/L (ref 101–111)
CO2: 37 mmol/L — ABNORMAL HIGH (ref 22–32)
Calcium: 8.1 mg/dL — ABNORMAL LOW (ref 8.9–10.3)
Creatinine, Ser: 0.7 mg/dL (ref 0.44–1.00)
GFR calc non Af Amer: >60 mL/min (ref >60)
Glucose, Bld: 118 mg/dL — ABNORMAL HIGH (ref 65–99)
POTASSIUM: 3.6 mmol/L (ref 3.5–5.1)
SODIUM: 143 mmol/L (ref 135–145)

## 2015-11-03 MED ORDER — FUROSEMIDE 10 MG/ML IJ SOLN
40.0000 mg | Freq: Two times a day (BID) | INTRAMUSCULAR | Status: DC
  Administered 2015-11-03 – 2015-11-04 (×3): 40 mg via INTRAVENOUS
  Filled 2015-11-03 (×3): qty 4

## 2015-11-03 MED ORDER — VANCOMYCIN HCL IN DEXTROSE 750-5 MG/150ML-% IV SOLN
750.0000 mg | Freq: Two times a day (BID) | INTRAVENOUS | Status: DC
  Administered 2015-11-03 – 2015-11-04 (×2): 750 mg via INTRAVENOUS
  Filled 2015-11-03 (×4): qty 150

## 2015-11-03 NOTE — Progress Notes (Signed)
PROGRESS NOTE  Misty Buckley  ZOX:[id number] DOB: Aug 09, 1940 DOA: 11/01/2015 PCP: Thane Edu, MD  Brief Narrative:   Misty Buckley is a 74 y.o. female with medical history significant of atrial fibrillation on Xarelto, benign essential tremor, cervical spondylosis, chronic venous insufficiency, cervical spine disc disease, fibromyalgia, hypertension, insomnia, morbid obesity, osteoarthritis who comes to the emergency department from her rehabilitation facility due to worsening of her right lower extremity cellulitis.  Per patient, about 30 days ago she developed edema, erythema, tenderness of her right lower extremity pretibial area after the appearance of a spontaneous oozing wound. She was given an oral antibiotic until about 2 weeks ago, but the wound did not heal.  For 2 days prior to admission, she had noticed recurrence of the symptoms, but denies fever or chills.    Assessment & Plan:   Principal Problem:   Cellulitis of right lower extremity Active Problems:   Essential hypertension, benign   Sleep apnea   PAF (paroxysmal atrial fibrillation) (HCC)   Constipation   Insomnia    Cellulitis of right lower extremity with draining ulcer of the anterior right shin, likely due to chronic venous stasis. - Continue IV vancomycin and Zosyn. -  Tylenol as needed for pain, since the patient's BP is very sensitive to narcotics use. - MRI of the right lower extremity:  Superficial ulcer, does not track or extend to bone -  ABI -  Place Unna boots if ABI normal -  Elevate legs -  Increase lasix  Acute on chronic diastolic heart failure with lower extremity edema and rales on exam. -  Daily weights -  Strict I/O -  Increase to lasix 40mg  IV BID -  Trend potassium  Active Problems:   Essential hypertension, benign Continue metoprolol 50 mg by mouth daily. Continue furosemide 40 mg by mouth daily.    Sleep apnea Not on CPAP. Supplemental oxygen at bedtime.    PAF  (paroxysmal atrial fibrillation) (HCC) CHA2DS2-VASc Score of at least 3. Continue metoprolol for rate control. Continue on Xarelto 20 mg by mouth daily.    Constipation Continue stool softeners.    Insomnia Continue nightly melatonin as needed.    Hemorrhoids Continue Anusol HC suppositories.   DVT prophylaxis:  Xarelto Code Status:  full Family Communication:  Patient alone Disposition Plan:   ABI, then unna boots if normal.  Legs are improving.  Plan for back to SNF at discharge unless patient adamant about going home with son.     Consultants:   none  Procedures:  none  Antimicrobials:   Vancomycin   Zosyn    Subjective: Improved redness, swelling, pain, and drainage from the right leg.  Increased shortness of breath and cough.    Objective: Vitals:   11/02/15 2101 11/03/15 0445 11/03/15 1114 11/03/15 1300  BP: (!) 128/56 (!) 97/50  (!) 122/54  Pulse: (!) 103 77  79  Resp: 20 20  20   Temp: 97.5 F (36.4 C) 97.5 F (36.4 C)  97.7 F (36.5 C)  TempSrc: Oral Oral  Oral  SpO2: 96% 97%  96%  Weight:   115.1 kg (253 lb 12.8 oz)   Height:        Intake/Output Summary (Last 24 hours) at 11/03/15 1729 Last data filed at 11/03/15 1339  Gross per 24 hour  Intake              590 ml  Output  800 ml  Net             -210 ml   Filed Weights   11/01/15 1400 11/03/15 1114  Weight: 115.2 kg (254 lb) 115.1 kg (253 lb 12.8 oz)    Examination:  General exam:  Adult female.  No acute distress.  HEENT:  NCAT, MMM Respiratory system: Rales and wheeze bilaterally.  No rhonchi.   Cardiovascular system: Regular rate and rhythm, normal S1/S2. No murmurs, rubs, gallops or clicks.  Warm extremities Gastrointestinal system: Normal active bowel sounds, soft, nondistended, nontender. MSK:  Normal tone and bulk, 2+ pitting bilateral lower extremity edema with erythema of the bilateral shins.  The right shin has a depression with a central ulcer that is  draining serous fluid, but less than yesterday.  Skin surrounding the ulcer and on the lateral shin is smooth and fragile.   Neuro:  Grossly intact    Data Reviewed: I have personally reviewed following labs and imaging studies  CBC:  Recent Labs Lab 11/01/15 2019 11/02/15 0345 11/03/15 0557  WBC 9.6 8.7 6.8  NEUTROABS 7.1 6.1  --   HGB 14.5 12.9 12.1  HCT 49.0* 44.5 43.7  MCV 97.0 98.9 101.6*  PLT 165 166 151   Basic Metabolic Panel:  Recent Labs Lab 11/01/15 2019 11/02/15 0345 11/03/15 0557  NA 137 144 143  K 3.7 4.0 3.6  CL 97* 104 103  CO2 32 34* 37*  GLUCOSE 109* 122* 118*  BUN 18 17 13   CREATININE 0.81 0.84 0.70  CALCIUM 9.0 8.1* 8.1*   GFR: Estimated Creatinine Clearance: 76.8 mL/min (by C-G formula based on SCr of 0.8 mg/dL). Liver Function Tests:  Recent Labs Lab 11/01/15 2019 11/02/15 0345  AST 20 19  ALT 12* 8*  ALKPHOS 91 79  BILITOT 0.8 0.6  PROT 6.9 5.7*  ALBUMIN 3.4* 2.8*   No results for input(s): LIPASE, AMYLASE in the last 168 hours. No results for input(s): AMMONIA in the last 168 hours. Coagulation Profile: No results for input(s): INR, PROTIME in the last 168 hours. Cardiac Enzymes: No results for input(s): CKTOTAL, CKMB, CKMBINDEX, TROPONINI in the last 168 hours. BNP (last 3 results) No results for input(s): PROBNP in the last 8760 hours. HbA1C: No results for input(s): HGBA1C in the last 72 hours. CBG: No results for input(s): GLUCAP in the last 168 hours. Lipid Profile: No results for input(s): CHOL, HDL, LDLCALC, TRIG, CHOLHDL, LDLDIRECT in the last 72 hours. Thyroid Function Tests: No results for input(s): TSH, T4TOTAL, FREET4, T3FREE, THYROIDAB in the last 72 hours. Anemia Panel: No results for input(s): VITAMINB12, FOLATE, FERRITIN, TIBC, IRON, RETICCTPCT in the last 72 hours. Urine analysis:    Component Value Date/Time   COLORURINE AMBER (A) 06/14/2015 1351   APPEARANCEUR CLOUDY (A) 06/14/2015 1351   LABSPEC  1.012 06/14/2015 1351   PHURINE 5.0 06/14/2015 1351   GLUCOSEU NEGATIVE 06/14/2015 1351   HGBUR TRACE (A) 06/14/2015 1351   BILIRUBINUR NEGATIVE 06/14/2015 1351   KETONESUR NEGATIVE 06/14/2015 1351   PROTEINUR NEGATIVE 06/14/2015 1351   NITRITE NEGATIVE 06/14/2015 1351   LEUKOCYTESUR NEGATIVE 06/14/2015 1351   Sepsis Labs: @LABRCNTIP (procalcitonin:4,lacticidven:4)  ) Recent Results (from the past 240 hour(s))  MRSA PCR Screening     Status: None   Collection Time: 11/02/15 12:27 AM  Result Value Ref Range Status   MRSA by PCR NEGATIVE NEGATIVE Final    Comment:        The GeneXpert MRSA Assay (FDA approved for NASAL  specimens only), is one component of a comprehensive MRSA colonization surveillance program. It is not intended to diagnose MRSA infection nor to guide or monitor treatment for MRSA infections.       Radiology Studies: Mr Tibia Fibula Right W Wo Contrast  Result Date: 11/03/2015 CLINICAL DATA:  Ulcer on motion. Chronic venous insufficiency. Left lower extremity cellulitis presents for right lower extremity swelling. EXAM: MRI OF LOWER RIGHT EXTREMITY WITHOUT AND WITH CONTRAST TECHNIQUE: Multiplanar, multisequence MR imaging of the right lower leg was performed both before and after administration of intravenous contrast. CONTRAST:  20mL MULTIHANCE GADOBENATE DIMEGLUMINE 529 MG/ML IV SOLN COMPARISON:  MR left lower leg 06/15/2015 FINDINGS: Bones/Joint/Cartilage No marrow signal abnormality. No fracture or subluxation. No periosteal reaction or bone destruction. No aggressive osseous lesion. Mild osteoarthritis of the medial and lateral femorotibial compartment of the right knee. Ligaments Visualized medial and lateral collateral ligaments are intact. Muscles and Tendons No muscle signal abnormality. No muscle edema or atrophy. No intramuscular hematoma or fluid collection. Soft tissues Soft tissue edema in the subcutaneous fat primarily along the lateral aspect of the  right lower leg bilaterally, slightly worse on the left. No significant enhancement. No fluid collection or hematoma. IMPRESSION: 1. Soft tissue edema in the subcutaneous fat primarily along the lateral aspect of the right lower leg bilaterally, slightly worse on the left. Differential considerations include mild cellulitis versus edema secondary to venous insufficiency given the patient's history. Electronically Signed   By: Elige KoHetal  Patel   On: 11/03/2015 08:18     Scheduled Meds: . calcium-vitamin D  1 tablet Oral Q breakfast  . DULoxetine  30 mg Oral QHS  . DULoxetine  60 mg Oral Daily  . estradiol  1 Applicatorful Vaginal QHS  . feeding supplement (ENSURE ENLIVE)  237 mL Oral BID BM  . furosemide  40 mg Intravenous BID  . hydrocortisone  25 mg Rectal BID  . Melatonin  4.5 mg Oral QHS  . metoprolol succinate  25 mg Oral BID  . multivitamin with minerals  1 tablet Oral Daily  . piperacillin-tazobactam (ZOSYN)  IV  3.375 g Intravenous Q8H  . rivaroxaban  20 mg Oral Q supper  . vancomycin  750 mg Intravenous Q12H   Continuous Infusions:    LOS: 2 days    Time spent: 30 min    Renae FickleSHORT, Misty Couts, MD [facility] Pager [phone]  If 7PM-7AM, please contact night-coverage [email or web address] Password [email or web address] 11/03/2015, 5:29 PM

## 2015-11-03 NOTE — Progress Notes (Signed)
Rept to Dr. Malachi BondsShort regarding pt's BP's. Per her order proceed with IV lasix. Ultrasound aware that pt needs ABI's. Dr. Malachi BondsShort re-entered the order.

## 2015-11-03 NOTE — Progress Notes (Signed)
Rept to Dr. Malachi BondsShort. Pt repts an increase in SOB this AM. Pt repts a history of SOB per her baseline but worse this AM. Resps don't appear labored. Pt able to talk without being winded. Pt sats 92% 2 lpm. IS given to pt with verbal instruction. Pt able to return demonstrate proper use of IS. Pt able to raise IS 500. Goal set for 750. Will continue to monitor.

## 2015-11-03 NOTE — Consult Note (Addendum)
WOC Nurse wound consult note Reason for Consult: Assess wound on Right Pre-tibial Wound type: Full thickness wound Pressure Ulcer POA: No Measurement: 1.5cm x 1.5 cm x 0.2 Wound bed: 90% slough, slight yellow and 10% red granulating tissue Drainage (amount, consistency, odor) wound itself scant serosanguinous drainage, legs weeping clear fluid Periwound: cellulitis in bilateral lower extremities Dressing procedure/placement/frequency: Wrap lower legs in Xeroform, cover with ABD pads, wrap with kerlex, apply ACE wrap starting at mid foot area and wrap from there to knee in a spiral pattern.  Change daily and prn wetness. We will not follow, but will remain available to this patient, to nursing, and the medical  teams.      Barnett HatterMelinda Quention Mcneill, RN-C, WTA-C Wound Treatment Associate

## 2015-11-03 NOTE — Progress Notes (Signed)
Pharmacy Antibiotic Note  Misty Buckley is a 75 y.o. female admitted on 11/01/2015 with worsening RLE cellulitis.  Pharmacy has been consulted for vancomycin and Zosyn dosing.  Patient's renal function has been stable.  Plan: - Change vanc to 750mg  IV Q12H for goal trough 10-15 mcg/mL - Zosyn 3.375gm IV Q8H, 4 hr infusion - Monitor renal fxn, clinical progress, VT as indicated - F/U narrowing abx   Height: 5\' 4"  (162.6 cm) Weight: 253 lb 12.8 oz (115.1 kg) IBW/kg (Calculated) : 54.7  Temp (24hrs), Avg:97.6 F (36.4 C), Min:97.5 F (36.4 C), Max:97.7 F (36.5 C)   Recent Labs Lab 11/01/15 2019 11/01/15 2033 11/02/15 0345 11/03/15 0557  WBC 9.6  --  8.7 6.8  CREATININE 0.81  --  0.84 0.70  LATICACIDVEN  --  1.32  --   --     Estimated Creatinine Clearance: 76.8 mL/min (by C-G formula based on SCr of 0.8 mg/dL).    Allergies  Allergen Reactions  . Hydromorphone Other (See Comments)    Decreased B/P & HR  . Morphine Other (See Comments)    Decreased B/P & HR  . Risperidone And Related Other (See Comments)    Mentally confused/ drooling  . Vancomycin Other (See Comments)    May have caused renal issues  . Zolpidem Other (See Comments)    Confusion,  Dizziness, and hallucinations    Antimicrobials this admission: Vanc 8/22 >> Zosyn 8/22 >>  Dose adjustments this admission: N/A  Microbiology results: 8/23 MRSA PCR - negative   Reza Crymes D. Laney Potashang, PharmD, BCPS Pager:  [phone] 11/03/2015, 1:51 PM

## 2015-11-04 ENCOUNTER — Inpatient Hospital Stay (HOSPITAL_COMMUNITY): Payer: Medicare (Managed Care)

## 2015-11-04 DIAGNOSIS — I872 Venous insufficiency (chronic) (peripheral): Secondary | ICD-10-CM

## 2015-11-04 LAB — BASIC METABOLIC PANEL
ANION GAP: 4 — AB (ref 5–15)
BUN: 12 mg/dL (ref 6–20)
CHLORIDE: 96 mmol/L — AB (ref 101–111)
CO2: 40 mmol/L — ABNORMAL HIGH (ref 22–32)
Calcium: 8.5 mg/dL — ABNORMAL LOW (ref 8.9–10.3)
Creatinine, Ser: 0.69 mg/dL (ref 0.44–1.00)
Glucose, Bld: 99 mg/dL (ref 65–99)
POTASSIUM: 3.5 mmol/L (ref 3.5–5.1)
SODIUM: 140 mmol/L (ref 135–145)

## 2015-11-04 LAB — CBC
HEMATOCRIT: 43 % (ref 36.0–46.0)
HEMOGLOBIN: 12.4 g/dL (ref 12.0–15.0)
MCH: 28.9 pg (ref 26.0–34.0)
MCHC: 28.8 g/dL — ABNORMAL LOW (ref 30.0–36.0)
MCV: 100.2 fL — AB (ref 78.0–100.0)
Platelets: 174 10*3/uL (ref 150–400)
RBC: 4.29 MIL/uL (ref 3.87–5.11)
RDW: 13.7 % (ref 11.5–15.5)
WBC: 7.3 10*3/uL (ref 4.0–10.5)

## 2015-11-04 MED ORDER — AMOXICILLIN-POT CLAVULANATE 875-125 MG PO TABS: 1.0000 | ORAL_TABLET | Freq: Two times a day (BID) | ORAL | 0 refills | Status: AC

## 2015-11-04 MED ORDER — METOPROLOL SUCCINATE ER 25 MG PO TB24: 25.0000 mg | ORAL_TABLET | Freq: Two times a day (BID) | ORAL | 0 refills | Status: AC

## 2015-11-04 MED ORDER — HYDROCORTISONE ACETATE 25 MG RE SUPP: 25.0000 mg | Freq: Two times a day (BID) | RECTAL | 0 refills | Status: AC | PRN

## 2015-11-04 NOTE — Clinical Social Work Note (Signed)
Clinical Social Work Assessment  Patient Details  Name: Misty Buckley MRN: [id number] Date of Birth: [birth year]  Date of referral:  11/04/15               Reason for consult:  Facility Placement                Permission sought to share information with:    Permission granted to share information::   (Facilities)  Name::        Agency::     Relationship::     Contact Information:     Housing/Transportation Living arrangements for the past 2 months:  [place] (Patient was a Surveyor, minerals at [employer]. She will go back to facility upon discharge.) Source of Information:  Patient Patient Interpreter Needed:  None Criminal Activity/Legal Involvement Pertinent to Current Situation/Hospitalization:  No - Comment as needed Significant Relationships:   (Brad [phone]) Lives with:  Facility Resident Do you feel safe going back to the place where you live?  Yes Need for family participation in patient care:  Yes (Comment)  Care giving concerns:  SW met with patient at bedside. Patient states that she needs assistance with ADL's.   Social Worker assessment / plan: SW met with patient at bedside. Patient was alert and oriented. There was no family present. Patient states that she presents to [facility] due to cellulitis in her R leg. Patient states that while at home she was independent with ADL's. However, she now states that she needs assistance.  Employment status:  Retired Forensic scientist:   [employer] PT Recommendations:  [place] / Referral to community resources:   (SW spoke with pt about current facility.)  Patient/Family's Response to care:  Patient is accepting and appropriate at this time.  Patient/Family's Understanding of and Emotional Response to Diagnosis, Current Treatment, and Prognosis:  Patient has no questions for SW.  Emotional Assessment Appearance:  Appears stated age Attitude/Demeanor/Rapport:    (Appropriate) Affect (typically observed):  Accepting, Appropriate Orientation:  Oriented to Self, Oriented to Place, Oriented to  Time, Oriented to Situation Alcohol / Substance use:  Not Applicable Psych involvement (Current and /or in the community):  No (Comment)  Discharge Needs  Concerns to be addressed:  Adjustment to Illness Readmission within the last 30 days:    Current discharge risk:  None Barriers to Discharge:  No Barriers Identified   Bernita Buffy 11/04/2015, 11:46 AM

## 2015-11-04 NOTE — Clinical Social Work Placement (Signed)
   CLINICAL SOCIAL WORK PLACEMENT  NOTE  Date:  11/04/2015  Patient Details  Name: Misty Buckley MRN: [id number] Date of Birth: 1941/01/26  Clinical Social Work is seeking post-discharge placement for this patient at the Skilled  Nursing Facility level of care (*CSW will initial, date and re-position this form in  chart as items are completed):  Yes   Patient/family provided with [place] Clinical Social Work Department's list of facilities offering this level of care within the geographic area requested by the patient (or if unable, by the patient's family).  Yes   Patient/family informed of their freedom to choose among providers that offer the needed level of care, that participate in Medicare, Medicaid or managed care program needed by the patient, have an available bed and are willing to accept the patient.  Yes   Patient/family informed of [facility]'s ownership interest in [facility] and [facility], as well as of the fact that they are under no obligation to receive care at these facilities.  PASRR submitted to EDS on       PASRR number received on       Existing PASRR number confirmed on       FL2 transmitted to all facilities in geographic area requested by pt/family on       FL2 transmitted to all facilities within larger geographic area on       Patient informed that his/her managed care company has contracts with or will negotiate with certain facilities, including the following:        Yes   Patient/family informed of bed offers received.  Patient chooses bed at  [facility]     Physician recommends and patient chooses bed at      Patient to be transferred to   on  .  Patient to be transferred to facility by       Patient family notified on   of transfer.  Name of family member notified:        PHYSICIAN       Additional Comment:    _______________________________________________ Alene MiresWhitaker, Maythe Deramo R 11/04/2015, 11:50 AM

## 2015-11-04 NOTE — Progress Notes (Signed)
SW reached out to [facility] [employer] [employer] to inform them that pt would be going back to facility. SW awaiting call. SW made facility/[facility] aware and told her patient will probably be transported around 2:00pm so patient can have her lunch.  SW offered to call pt's children. However, she states that she will reach out herself.  Crista CurbBrittney Fran Mcree, MSW [phone]

## 2015-11-04 NOTE — Progress Notes (Signed)
Pharmacy Antibiotic Note Misty Buckley is a 75 y.o. female admitted on 11/01/2015 with worsening RLE cellulitis. Currently on day 3 of Zosyn and vancomycin.   Plan: - Continue vancomycin to 750mg  IV Q12H - Zosyn 3.375gm IV Q8H, 4 hr infusion - F/u transitioning to PO when feasible     Height: 5\' 4"  (162.6 cm) Weight: 253 lb 12.8 oz (115.1 kg) IBW/kg (Calculated) : 54.7  Temp (24hrs), Avg:97.6 F (36.4 C), Min:97.5 F (36.4 C), Max:97.7 F (36.5 C)   Recent Labs Lab 11/01/15 2019 11/01/15 2033 11/02/15 0345 11/03/15 0557 11/04/15 0637  WBC 9.6  --  8.7 6.8 7.3  CREATININE 0.81  --  0.84 0.70 0.69  LATICACIDVEN  --  1.32  --   --   --     Estimated Creatinine Clearance: 76.8 mL/min (by C-G formula based on SCr of 0.8 mg/dL).    Allergies  Allergen Reactions  . Hydromorphone Other (See Comments)    Decreased B/P & HR  . Morphine Other (See Comments)    Decreased B/P & HR  . Risperidone And Related Other (See Comments)    Mentally confused/ drooling  . Vancomycin Other (See Comments)    May have caused renal issues  . Zolpidem Other (See Comments)    Confusion,  Dizziness, and hallucinations   Antimicrobials this admission: Vanc 8/22 >> Zosyn 8/22 >>  Dose adjustments this admission: N/A  Microbiology results: 8/23 MRSA PCR - negative   Pollyann SamplesAndy Brittish Bolinger, PharmD, BCPS 11/04/2015, 9:24 AM Pager: [phone]

## 2015-11-04 NOTE — Care Management Important Message (Signed)
Important Message  Patient Details  Name: Brown Humanthel M Wisinski MRN: [id number] Date of Birth: 04-Sep-1940   Medicare Important Message Given:  Yes    Mallika Sanmiguel 11/04/2015, 11:09 AM

## 2015-11-04 NOTE — Progress Notes (Signed)
Report gave to RN Marcelino DusterMichelle at [facility]. Pt's dressing changed and pain is managed well.

## 2015-11-04 NOTE — Progress Notes (Signed)
NT notified charge RN that pt reported missing items from yesterday when she went to MRI department. Pt states that she had a heart shaped necklace and 2 heart shaped turquoise earrings at the time she went down for test. She took them off prior to start of test and handed them to a "girl." She states she forgot to ask about getting it back when she left. MRI called-they have no belongings for this pt. Security called and waiting to hear back.   [place], Latricia HeftKorie G

## 2015-11-04 NOTE — Progress Notes (Addendum)
SW spoke with Landscape architectorsha/patient Social Worker at [facility] and informed her that pt is scheduled to return [facility]. She expressed no concerns.  SW also asked would she be able to transport the pt's home equipment. She states [facility] and staff will come to get pt's belongings. Patient made aware, and expressed no concerns.  Crista CurbBrittney Oakley Kossman, MSW [phone]

## 2015-11-04 NOTE — Discharge Summary (Signed)
Physician Discharge Summary  Misty Buckley ZOX:[id number] DOB: 1941-01-02 DOA: 11/01/2015  PCP: Thane Edu, MD  Admit date: 11/01/2015 Discharge date: 11/04/2015  Admitted From: SNF  Disposition:  SNF  Recommendations for Outpatient Follow-up:  1. Follow up with PCP in 1-2 weeks 2. Please obtain BMP/CBC in one week 3. Ongoing physical and occupational therapy  Equipment/Devices:   Wrap lower legs in Xeroform, cover with ABD pads, wrap with kerlex, apply ACE wrap starting at mid foot area and wrap from there to knee in a spiral pattern.  Change daily and prn wetness.  Nightly 2L O2 by nasal cannula.    Discharge Condition:  Stable, improved CODE STATUS:  Full code Diet recommendation:  Low sodium   Brief/Interim Summary:  Misty Buckley a 75 y.o.femalewith medical history significant of atrial fibrillationon Xarelto, benign essential tremor, cervical spondylosis, chronic venous insufficiency, cervical spine disc disease, fibromyalgia, hypertension, insomnia, morbid obesity, osteoarthritis who comes to the emergency department from her rehabilitation facility due to worsening of her right lower extremity cellulitis.  Per patient, about 30 days ago she developed edema, erythema,tenderness of her right lower extremity pretibial area after the appearance of a spontaneous oozing wound. She was given an oral antibiotic until about 2 weeks ago, but the wound did not heal.  For 2 days prior to admission, she had noticed recurrence of the symptoms, but denies fever or chills.    Discharge Diagnoses:  Principal Problem:   Cellulitis of right lower extremity Active Problems:   Chronic venous insufficiency   Essential hypertension, benign   Sleep apnea   PAF (paroxysmal atrial fibrillation) (HCC)   Constipation   Insomnia  Cellulitis of right lower extremity with draining ulcer of the anterior right shin, likely due to chronic venous stasis.  She was started on vancomycin and  zosyn and had improvement in her erythema.  She was diuresed with IV lasix to help with her swelling and her legs were elevated.  MRI confirmed that the right lower extremity anterior shin ulcer was superficial and did not track or extend to bone.  The wound care nurse evaluated and recommended the plan listed above.  Although I think she would benefit from unna boots, she has been noncompliant with these in the past so we did not reapply.  Continue to elevate legs when resting.  Follow up at the wound care clinic in 1 month or sooner if needed.    Acute on chronic diastolic heart failure with lower extremity edema and rales on exam.  Improved with lasix 40mg  IV.  She should adhere to a low salt diet and continue lasix at discharge.  Active Problems: Essential hypertension, benign.  Blood pressures are typically low normal.  Continued metoprolol 25 mg po BID.  Continue furosemide 40 mg by mouth daily.  Sleep apnea, Not on CPAP.  Supplemental oxygen at bedtime, 2L  PAF (paroxysmal atrial fibrillation) (HCC) CHA2DS2-VASc Scoreof at least 3.  Recommend anticoagulation.   Continued metoprolol for rate control. Continued on Xarelto 20 mg by mouth daily.  Constipation, stable. Continued stool softeners.  Insomnia Continues nightly melatonin.  Hemorrhoids Continued Anusol HCsuppositories.  Discharge Instructions  Discharge Instructions    Call MD for:  difficulty breathing, headache or visual disturbances    Complete by:  As directed   Call MD for:  extreme fatigue    Complete by:  As directed   Call MD for:  hives    Complete by:  As directed   Call MD  for:  persistant dizziness or light-headedness    Complete by:  As directed   Call MD for:  persistant nausea and vomiting    Complete by:  As directed   Call MD for:  severe uncontrolled pain    Complete by:  As directed   Call MD for:  temperature >100.4    Complete by:  As directed   Diet - low sodium heart healthy     Complete by:  As directed   Increase activity slowly    Complete by:  As directed       Medication List    STOP taking these medications   LUBRIDERM Lotn     TAKE these medications   amoxicillin-clavulanate 875-125 MG tablet Commonly known as:  AUGMENTIN Take 1 tablet by mouth 2 (two) times daily.   CITRACAL PETITES/VITAMIN D 200-250 MG-UNIT Tabs Generic drug:  Calcium Citrate-Vitamin D Take 1 tablet by mouth daily.   diclofenac sodium 1 % Gel Commonly known as:  VOLTAREN Apply up to 4 times daily as needed for neck or back pain   DULoxetine 30 MG capsule Commonly known as:  CYMBALTA Take 30-60 mg by mouth 2 (two) times daily. Take 30mg  every morning and 60mg  every evening   estradiol 0.1 MG/GM vaginal cream Commonly known as:  ESTRACE Place 1 Applicatorful vaginally at bedtime.   furosemide 40 MG tablet Commonly known as:  LASIX If weight increases to 255lbs, take 1 pill daily until weight drops to 250lbs   hydrocortisone 25 MG suppository Commonly known as:  ANUSOL-HC Place 1 suppository (25 mg total) rectally 2 (two) times daily as needed for hemorrhoids or itching. What changed:  when to take this  reasons to take this   hydroxypropyl methylcellulose / hypromellose 2.5 % ophthalmic solution Commonly known as:  ISOPTO TEARS / GONIOVISC Place 1 drop into both eyes 4 (four) times daily.   Melatonin 5 MG Tabs Take 5 mg by mouth at bedtime.   metoprolol succinate 25 MG 24 hr tablet Commonly known as:  TOPROL-XL Take 1 tablet (25 mg total) by mouth 2 (two) times daily. What changed:  medication strength  how much to take  when to take this  reasons to take this  additional instructions   multivitamin with minerals Tabs tablet Take 1 tablet by mouth daily.   omega-3 acid ethyl esters 1 g capsule Commonly known as:  LOVAZA Take 2 g by mouth daily.   OXYGEN Inhale 2 L into the lungs continuous.   rivaroxaban 20 MG Tabs tablet Commonly known as:   XARELTO Take 20 mg by mouth daily with supper.   senna-docusate 8.6-50 MG tablet Commonly known as:  SENNA-S Take 1 tablet by mouth at bedtime as needed for mild constipation. What changed:  how much to take      Follow-up Information    [facility]             . Schedule an appointment as soon as possible for a visit in 1 month(s).   Contact information: [address] [place] [phone]       Thane Edu, MD Follow up in 1 week(s).   Specialty:  Family Medicine Contact information: [address] [place] [address] [phone]          Allergies  Allergen Reactions  . Hydromorphone Other (See Comments)    Decreased B/P & HR  . Morphine Other (See Comments)  Decreased B/P & HR  . Risperidone And Related Other (See Comments)    Mentally confused/ drooling  . Vancomycin Other (See Comments)    May have caused renal issues  . Zolpidem Other (See Comments)    Confusion,  Dizziness, and hallucinations    Consultations: Wound care   Procedures/Studies: Mr Tibia Fibula Right W Wo Contrast  Result Date: 11/03/2015 CLINICAL DATA:  Ulcer on motion. Chronic venous insufficiency. Left lower extremity cellulitis presents for right lower extremity swelling. EXAM: MRI OF LOWER RIGHT EXTREMITY WITHOUT AND WITH CONTRAST TECHNIQUE: Multiplanar, multisequence MR imaging of the right lower leg was performed both before and after administration of intravenous contrast. CONTRAST:  20mL MULTIHANCE GADOBENATE DIMEGLUMINE 529 MG/ML IV SOLN COMPARISON:  MR left lower leg 06/15/2015 FINDINGS: Bones/Joint/Cartilage No marrow signal abnormality. No fracture or subluxation. No periosteal reaction or bone destruction. No aggressive osseous lesion. Mild osteoarthritis of the medial and lateral femorotibial compartment of the right knee. Ligaments Visualized medial and lateral collateral ligaments are  intact. Muscles and Tendons No muscle signal abnormality. No muscle edema or atrophy. No intramuscular hematoma or fluid collection. Soft tissues Soft tissue edema in the subcutaneous fat primarily along the lateral aspect of the right lower leg bilaterally, slightly worse on the left. No significant enhancement. No fluid collection or hematoma. IMPRESSION: 1. Soft tissue edema in the subcutaneous fat primarily along the lateral aspect of the right lower leg bilaterally, slightly worse on the left. Differential considerations include mild cellulitis versus edema secondary to venous insufficiency given the patient's history. Electronically Signed   By: Elige KoHetal  Patel   On: 11/03/2015 08:18   Mm Digital Screening Bilateral  Result Date: 10/10/2015 CLINICAL DATA:  Screening. EXAM: DIGITAL SCREENING BILATERAL MAMMOGRAM WITH CAD COMPARISON:  Previous exam(s). ACR Breast Density Category b: There are scattered areas of fibroglandular density. FINDINGS: There are no findings suspicious for malignancy. Images were processed with CAD. IMPRESSION: No mammographic evidence of malignancy. A result letter of this screening mammogram will be mailed directly to the patient. RECOMMENDATION: Screening mammogram in one year. (Code:SM-B-01Y) BI-RADS CATEGORY  1: Negative. Electronically Signed   By: Rolla Plateandolph  Jackson M.D.   On: 10/10/2015 09:46   Subjective:  Redness, pain and swelling of bilateral shins, right worse than left, has improved.  Denies fevers, chills, nausea, vomiting, diarrhea.  Had some cough and shortness of breath which have also improved.    Discharge Exam: Vitals:   11/03/15 2127 11/04/15 0515  BP: (!) 123/54 (!) 99/55  Pulse: 82 83  Resp: 19 17  Temp: 97.7 F (36.5 C) 97.5 F (36.4 C)   Vitals:   11/03/15 1114 11/03/15 1300 11/03/15 2127 11/04/15 0515  BP:  (!) 122/54 (!) 123/54 (!) 99/55  Pulse:  79 82 83  Resp:  20 19 17   Temp:  97.7 F (36.5 C) 97.7 F (36.5 C) 97.5 F (36.4 C)   TempSrc:  Oral Oral Oral  SpO2:  96% 96% 100%  Weight: 115.1 kg (253 lb 12.8 oz)     Height:         General exam:  Adult female.  No acute distress.  HEENT:  NCAT, MMM Respiratory system: Rales at bases bilaterally, no wheezes, no rhonchi.   Cardiovascular system: Regular rate and rhythm, normal S1/S2. No murmurs, rubs, gallops or clicks.  Warm extremities Gastrointestinal system: Normal active bowel sounds, soft, nondistended, nontender. MSK:  Shins wrapped in ACE bandages.  I did not remove today to examine underneath, however, the  medial knee and thigh bilaterally are without erythema today.   Neuro:  Grossly intact    The results of significant diagnostics from this hospitalization (including imaging, microbiology, ancillary and laboratory) are listed below for reference.     Microbiology: Recent Results (from the past 240 hour(s))  MRSA PCR Screening     Status: None   Collection Time: 11/02/15 12:27 AM  Result Value Ref Range Status   MRSA by PCR NEGATIVE NEGATIVE Final    Comment:        The GeneXpert MRSA Assay (FDA approved for NASAL specimens only), is one component of a comprehensive MRSA colonization surveillance program. It is not intended to diagnose MRSA infection nor to guide or monitor treatment for MRSA infections.      Labs: BNP (last 3 results)  Recent Labs  06/14/15 1252  BNP 277.9*   Basic Metabolic Panel:  Recent Labs Lab 11/01/15 2019 11/02/15 0345 11/03/15 0557 11/04/15 0637  NA 137 144 143 140  K 3.7 4.0 3.6 3.5  CL 97* 104 103 96*  CO2 32 34* 37* 40*  GLUCOSE 109* 122* 118* 99  BUN 18 17 13 12   CREATININE 0.81 0.84 0.70 0.69  CALCIUM 9.0 8.1* 8.1* 8.5*   Liver Function Tests:  Recent Labs Lab 11/01/15 2019 11/02/15 0345  AST 20 19  ALT 12* 8*  ALKPHOS 91 79  BILITOT 0.8 0.6  PROT 6.9 5.7*  ALBUMIN 3.4* 2.8*   No results for input(s): LIPASE, AMYLASE in the last 168 hours. No results for input(s): AMMONIA in  the last 168 hours. CBC:  Recent Labs Lab 11/01/15 2019 11/02/15 0345 11/03/15 0557 11/04/15 0637  WBC 9.6 8.7 6.8 7.3  NEUTROABS 7.1 6.1  --   --   HGB 14.5 12.9 12.1 12.4  HCT 49.0* 44.5 43.7 43.0  MCV 97.0 98.9 101.6* 100.2*  PLT 165 166 151 174   Cardiac Enzymes: No results for input(s): CKTOTAL, CKMB, CKMBINDEX, TROPONINI in the last 168 hours. BNP: Invalid input(s): POCBNP CBG: No results for input(s): GLUCAP in the last 168 hours. D-Dimer No results for input(s): DDIMER in the last 72 hours. Hgb A1c No results for input(s): HGBA1C in the last 72 hours. Lipid Profile No results for input(s): CHOL, HDL, LDLCALC, TRIG, CHOLHDL, LDLDIRECT in the last 72 hours. Thyroid function studies No results for input(s): TSH, T4TOTAL, T3FREE, THYROIDAB in the last 72 hours.  Invalid input(s): FREET3 Anemia work up No results for input(s): VITAMINB12, FOLATE, FERRITIN, TIBC, IRON, RETICCTPCT in the last 72 hours. Urinalysis    Component Value Date/Time   COLORURINE AMBER (A) 06/14/2015 1351   APPEARANCEUR CLOUDY (A) 06/14/2015 1351   LABSPEC 1.012 06/14/2015 1351   PHURINE 5.0 06/14/2015 1351   GLUCOSEU NEGATIVE 06/14/2015 1351   HGBUR TRACE (A) 06/14/2015 1351   BILIRUBINUR NEGATIVE 06/14/2015 1351   KETONESUR NEGATIVE 06/14/2015 1351   PROTEINUR NEGATIVE 06/14/2015 1351   NITRITE NEGATIVE 06/14/2015 1351   LEUKOCYTESUR NEGATIVE 06/14/2015 1351   Sepsis Labs Invalid input(s): PROCALCITONIN,  WBC,  LACTICIDVEN   Time coordinating discharge: Over 30 minutes  SIGNED:   Renae Fickle, MD  [facility] 11/04/2015, 11:53 AM Pager   If 7PM-7AM, please contact night-coverage [email or web address] Password [email or web address]

## 2015-11-04 NOTE — Plan of Care (Signed)
Problem: Education: Goal: Verbalization of understanding the information provided will improve Outcome: Progressing Pt. Aware of how cellulitis being treated.

## 2015-12-14 ENCOUNTER — Encounter (HOSPITAL_BASED_OUTPATIENT_CLINIC_OR_DEPARTMENT_OTHER): Payer: Medicare (Managed Care) | Attending: Surgery

## 2015-12-14 DIAGNOSIS — M479 Spondylosis, unspecified: Secondary | ICD-10-CM | POA: Insufficient documentation

## 2015-12-14 DIAGNOSIS — Z7901 Long term (current) use of anticoagulants: Secondary | ICD-10-CM | POA: Diagnosis not present

## 2015-12-14 DIAGNOSIS — Z6838 Body mass index (BMI) 38.0-38.9, adult: Secondary | ICD-10-CM | POA: Insufficient documentation

## 2015-12-14 DIAGNOSIS — I87331 Chronic venous hypertension (idiopathic) with ulcer and inflammation of right lower extremity: Secondary | ICD-10-CM | POA: Insufficient documentation

## 2015-12-14 DIAGNOSIS — M1990 Unspecified osteoarthritis, unspecified site: Secondary | ICD-10-CM | POA: Diagnosis not present

## 2015-12-14 DIAGNOSIS — I4891 Unspecified atrial fibrillation: Secondary | ICD-10-CM | POA: Diagnosis not present

## 2015-12-14 DIAGNOSIS — I10 Essential (primary) hypertension: Secondary | ICD-10-CM | POA: Insufficient documentation

## 2015-12-14 DIAGNOSIS — I890 Lymphedema, not elsewhere classified: Secondary | ICD-10-CM | POA: Diagnosis not present

## 2015-12-14 DIAGNOSIS — L97811 Non-pressure chronic ulcer of other part of right lower leg limited to breakdown of skin: Secondary | ICD-10-CM | POA: Insufficient documentation

## 2015-12-21 DIAGNOSIS — I87331 Chronic venous hypertension (idiopathic) with ulcer and inflammation of right lower extremity: Secondary | ICD-10-CM | POA: Diagnosis not present

## 2016-01-04 DIAGNOSIS — I87331 Chronic venous hypertension (idiopathic) with ulcer and inflammation of right lower extremity: Secondary | ICD-10-CM | POA: Diagnosis not present

## 2016-01-16 ENCOUNTER — Encounter (HOSPITAL_BASED_OUTPATIENT_CLINIC_OR_DEPARTMENT_OTHER): Payer: Medicare (Managed Care) | Attending: Internal Medicine

## 2016-01-16 DIAGNOSIS — I87331 Chronic venous hypertension (idiopathic) with ulcer and inflammation of right lower extremity: Secondary | ICD-10-CM | POA: Diagnosis present

## 2016-01-16 DIAGNOSIS — L97211 Non-pressure chronic ulcer of right calf limited to breakdown of skin: Secondary | ICD-10-CM | POA: Insufficient documentation

## 2016-01-16 DIAGNOSIS — I890 Lymphedema, not elsewhere classified: Secondary | ICD-10-CM | POA: Diagnosis not present

## 2016-02-17 ENCOUNTER — Encounter (HOSPITAL_BASED_OUTPATIENT_CLINIC_OR_DEPARTMENT_OTHER): Payer: Medicare (Managed Care) | Attending: Internal Medicine

## 2016-02-17 DIAGNOSIS — L97811 Non-pressure chronic ulcer of other part of right lower leg limited to breakdown of skin: Secondary | ICD-10-CM | POA: Diagnosis not present

## 2016-02-17 DIAGNOSIS — I890 Lymphedema, not elsewhere classified: Secondary | ICD-10-CM | POA: Insufficient documentation

## 2016-02-17 DIAGNOSIS — G4730 Sleep apnea, unspecified: Secondary | ICD-10-CM | POA: Diagnosis not present

## 2016-02-17 DIAGNOSIS — I87331 Chronic venous hypertension (idiopathic) with ulcer and inflammation of right lower extremity: Secondary | ICD-10-CM | POA: Insufficient documentation

## 2017-01-23 IMAGING — DX DG CHEST 2V
2 series · 2 of 2 positions shown · non-contrast
Comparison: 06/14/2015

CLINICAL DATA: Short of breath

EXAM:
CHEST  2 VIEW

[chest ap]
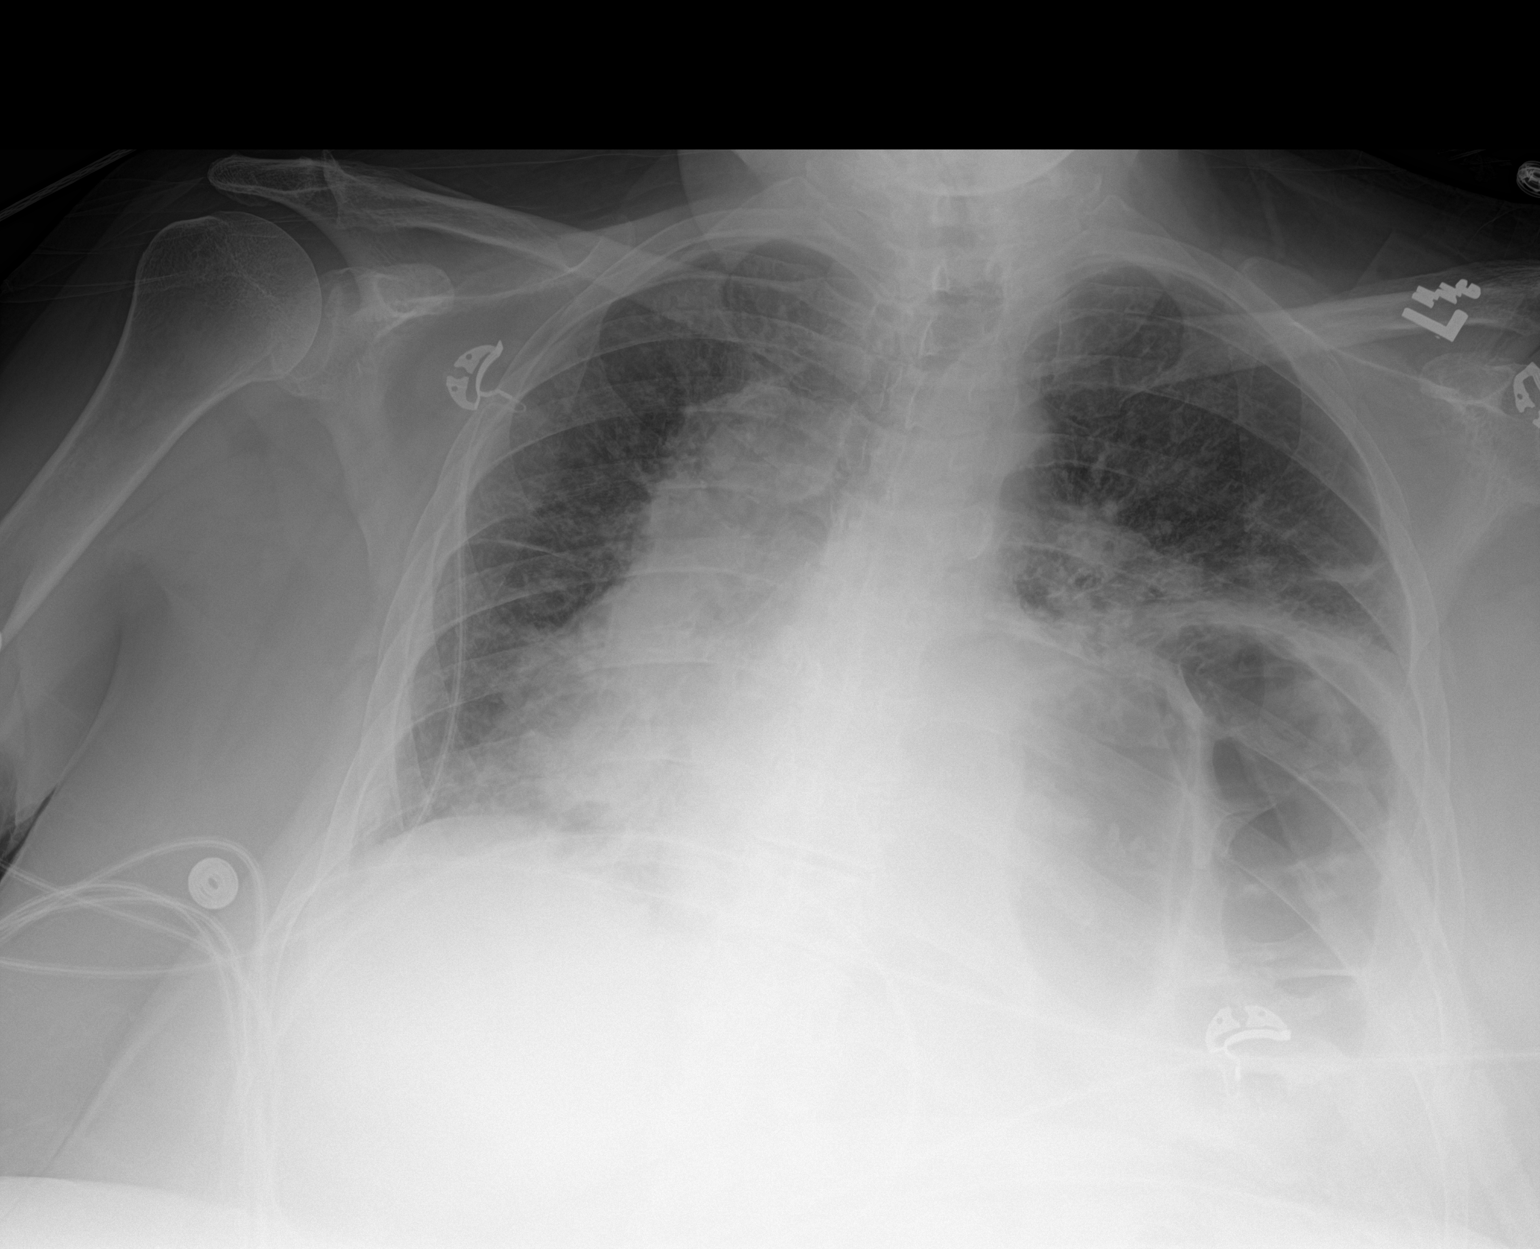

[chest lat]
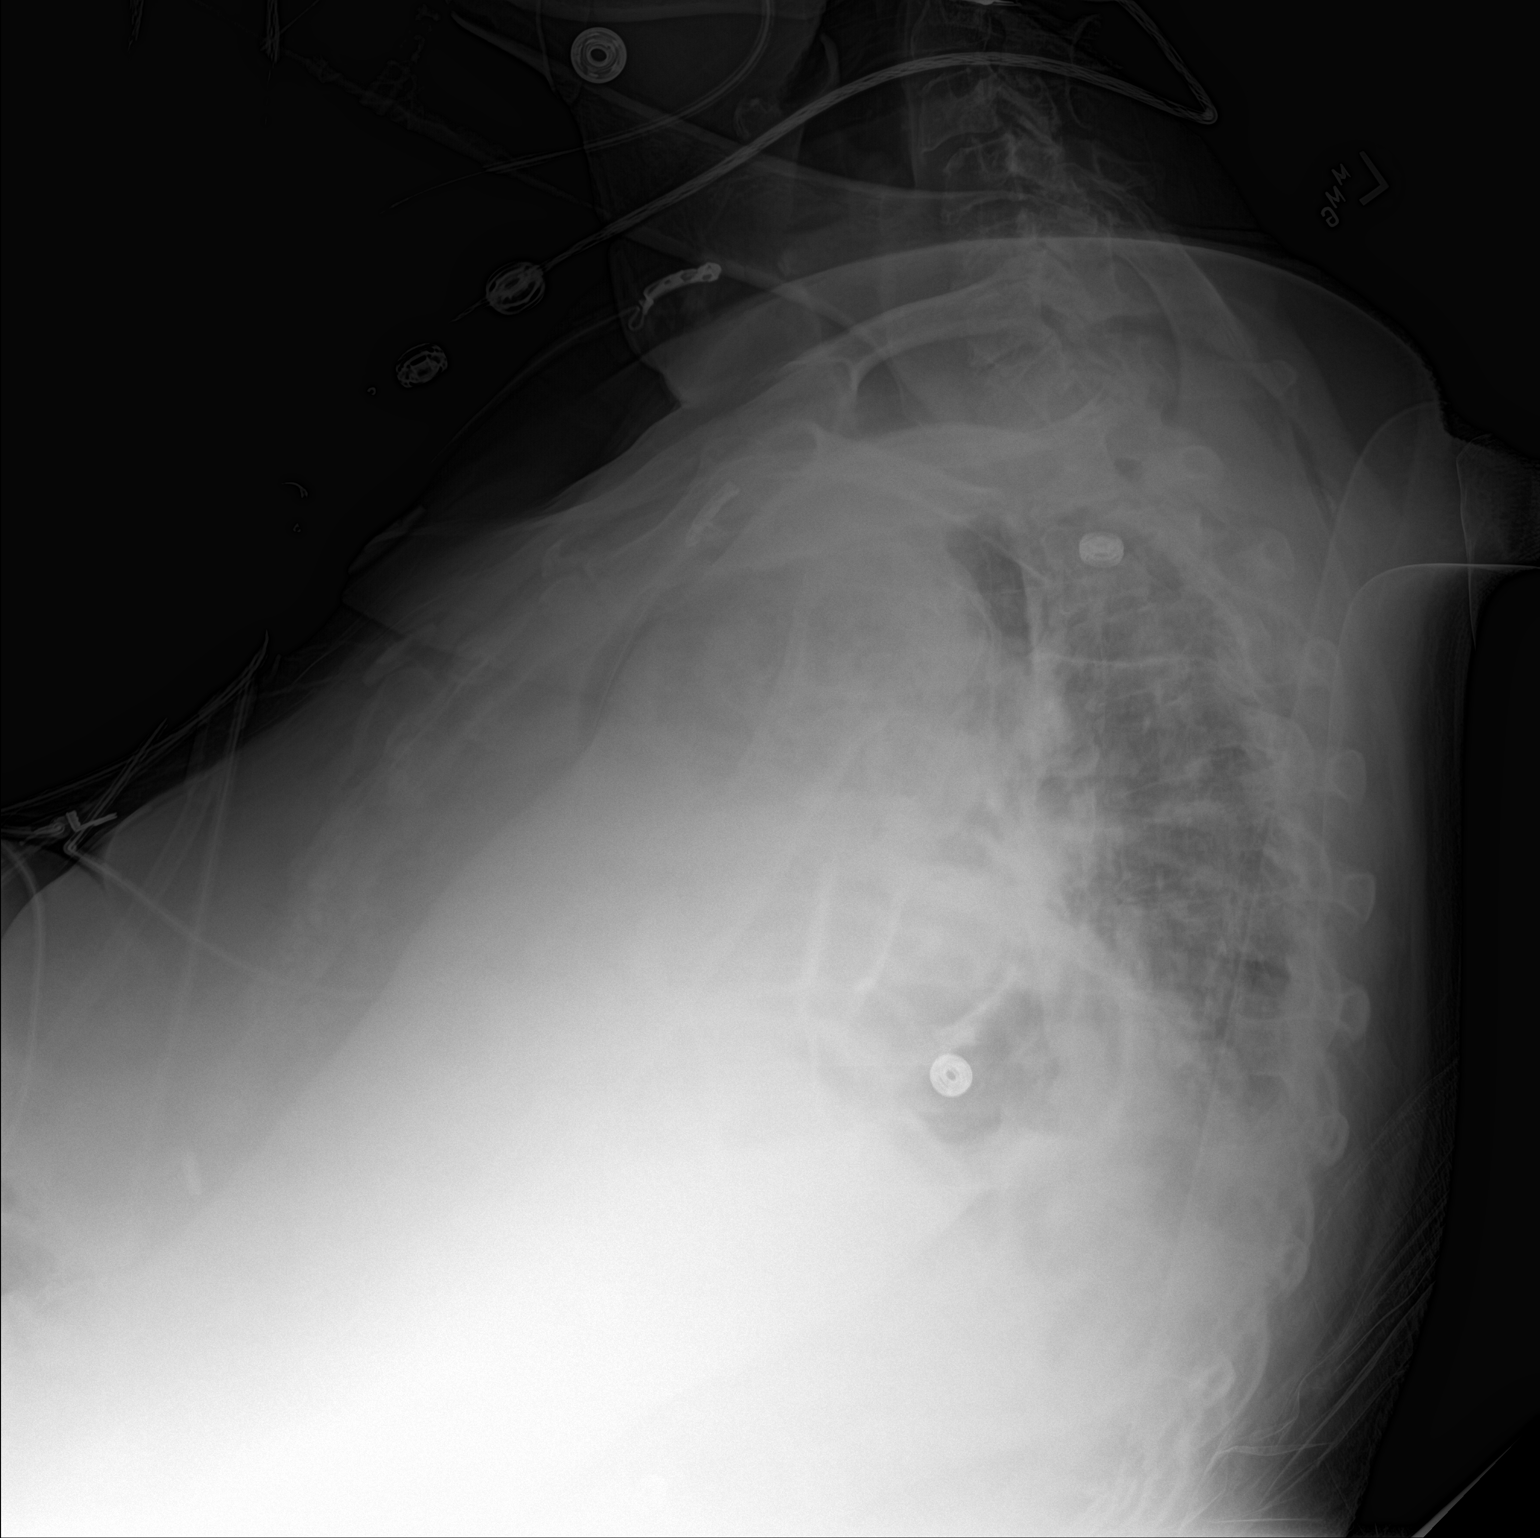

[2 of 2 positions shown; findings below may reference images not displayed]

FINDINGS: Left hemidiaphragm remains elevated. Left lower lobe airspace
disease consistent with atelectasis unchanged. Mild right lower lobe
airspace disease also unchanged.

Mild vascular congestion without significant edema or effusion.
IMPRESSION: Bibasilar atelectasis left greater than right is unchanged.

Mild vascular congestion without edema.

## 2017-01-23 IMAGING — MR MR [PERSON_NAME] LOW WO/W CM*L*
4 of 10 series · 19 of 40 positions shown · IV contrast (multihance)
Comparison: 06/14/2015

CLINICAL DATA: Progressive cellulitis in the left lower leg.
History of deep vein thrombosis.

EXAM:
MRI OF LOWER LEFT EXTREMITY WITHOUT AND WITH CONTRAST
TECHNIQUE: Multiplanar, multisequence MR imaging of the left calf was performed
both before and after administration of intravenous contrast.
CONTRAST:  20mL MULTIHANCE GADOBENATE DIMEGLUMINE 529 MG/ML IV SOLN

[Series 6: T1 · coronal · 5.0mm · 0.98mm/px · 4 of 44 slices shown (1 of 2)]
[im 1/44]
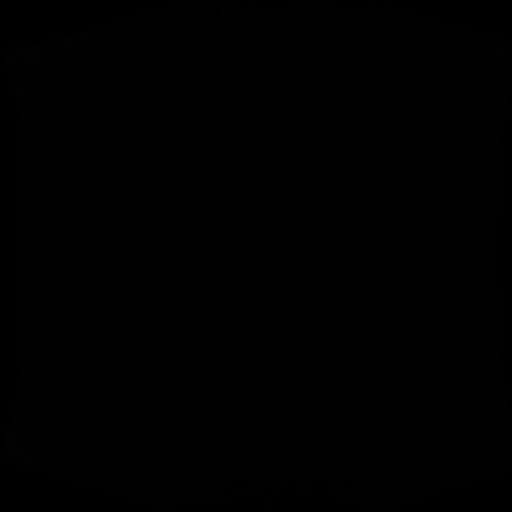
[im 15/44]
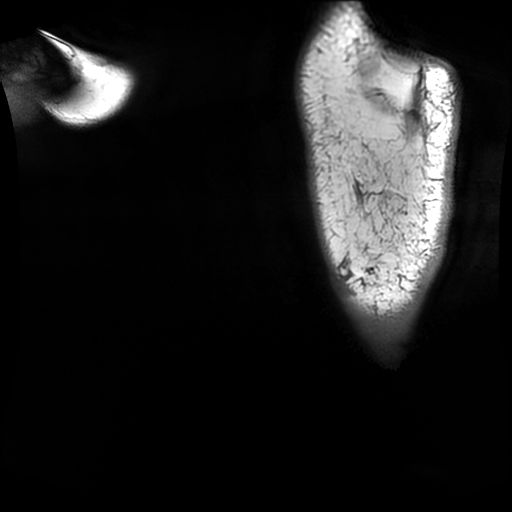
[im 29/44]
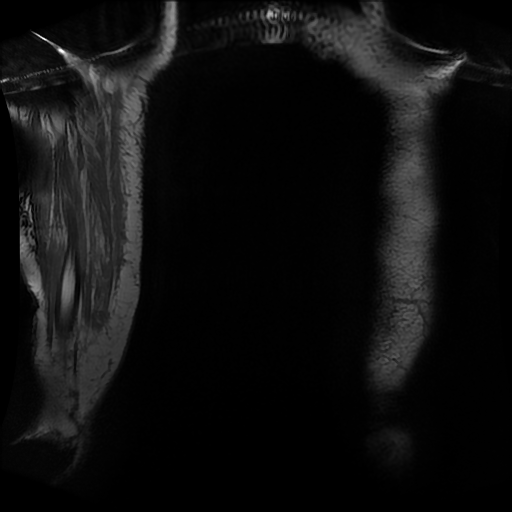
[im 44/44]
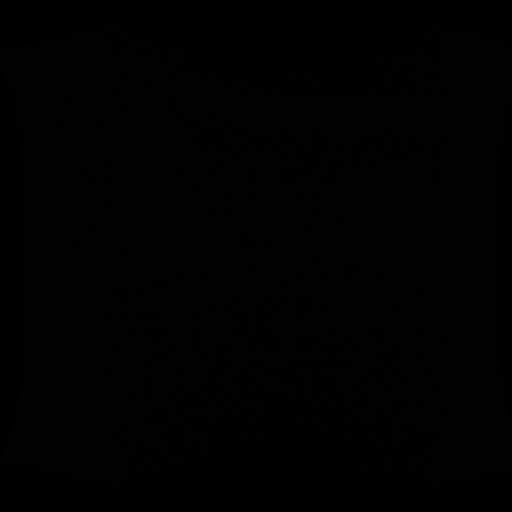

[Series 8: T1 · axial · 5.0mm · 0.98mm/px · z∈[-435,+40]mm · 5 of 74 slices shown (2 of 2)]
[im 1/74]
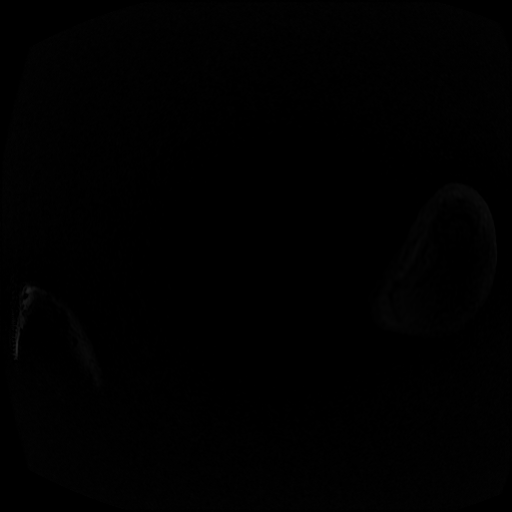
[im 19/74]
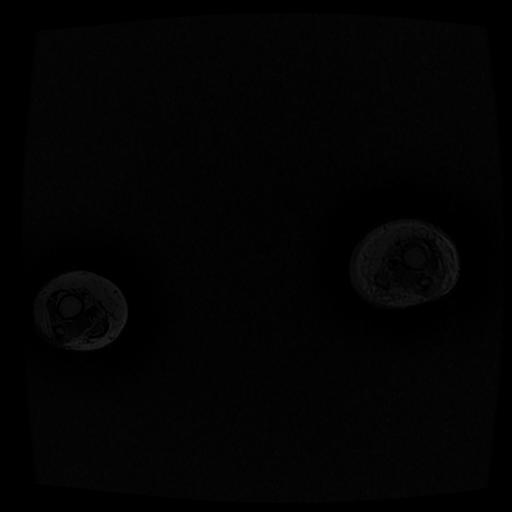
[im 37/74]
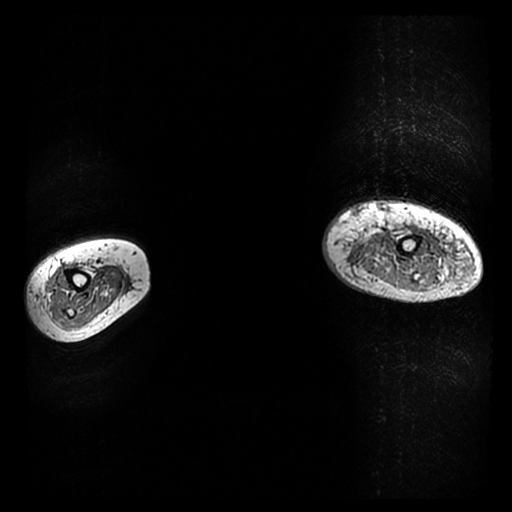
[im 55/74]
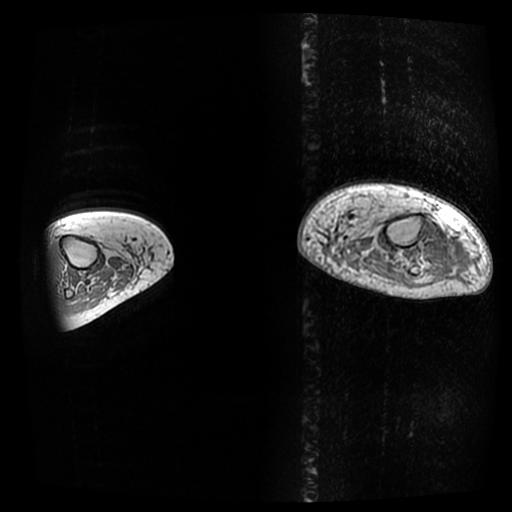
[im 74/74]
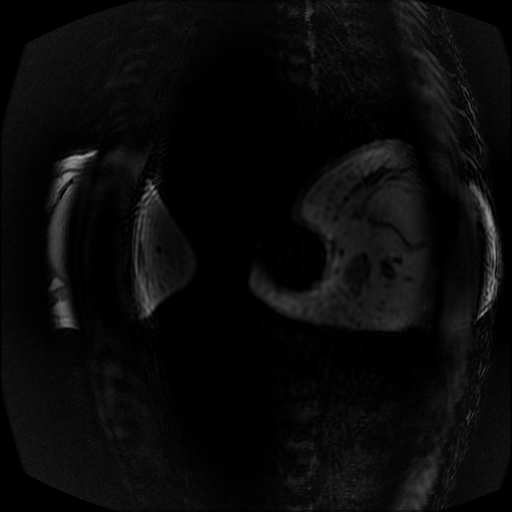

[Series 9: T1 fat-sat · axial · non-contrast · 5.0mm · 0.98mm/px · z∈[-435,+40]mm · 5 of 74 slices shown]
[im 1/74]
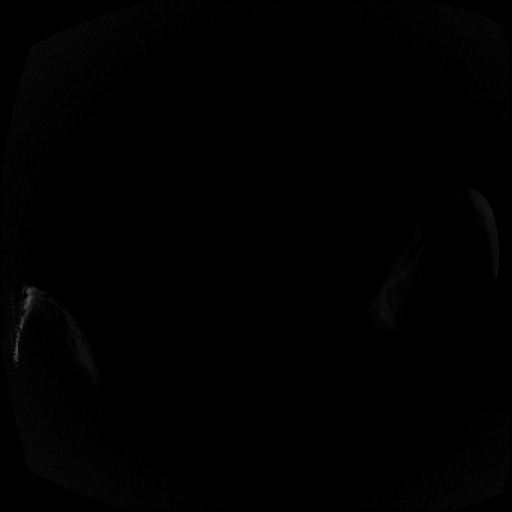
[im 19/74]
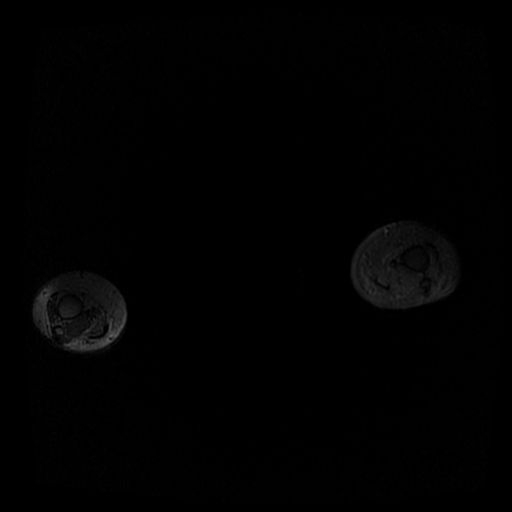
[im 37/74]
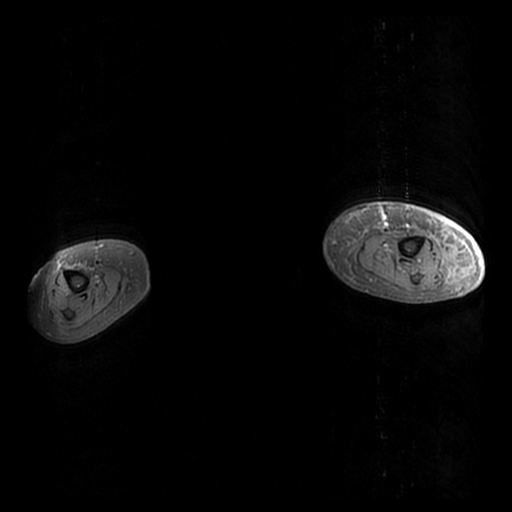
[im 55/74]
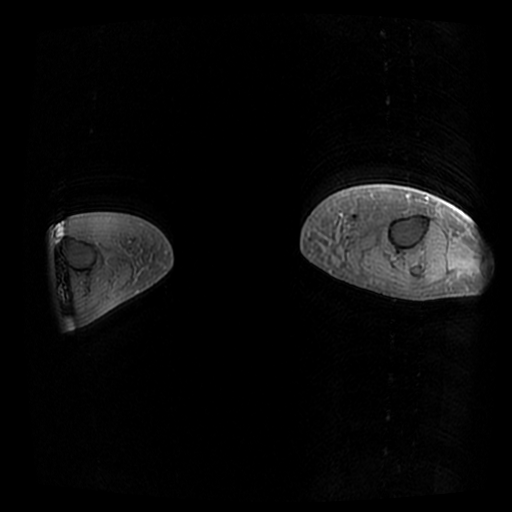
[im 74/74]
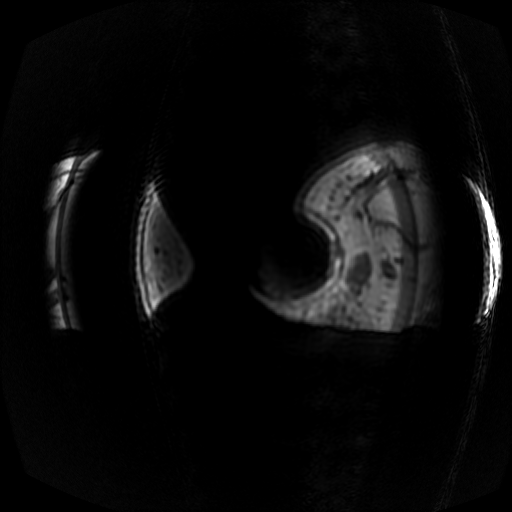

[Series 10: T2 fat-sat · axial · 5.0mm · 0.98mm/px · z∈[-435,+40]mm · 5 of 74 slices shown]
[im 1/74]
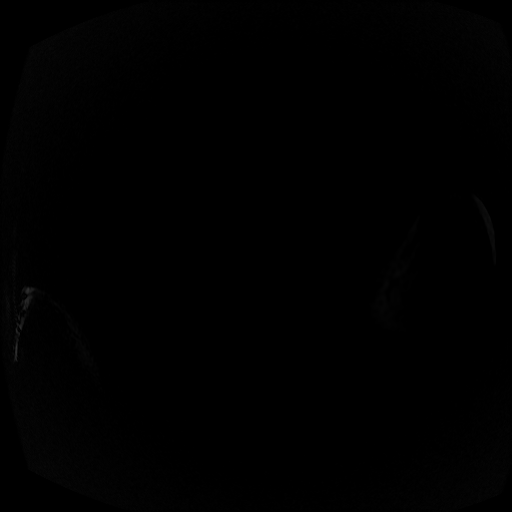
[im 19/74]
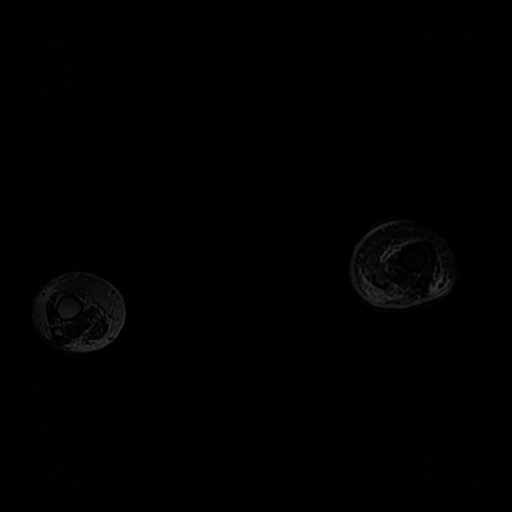
[im 37/74]
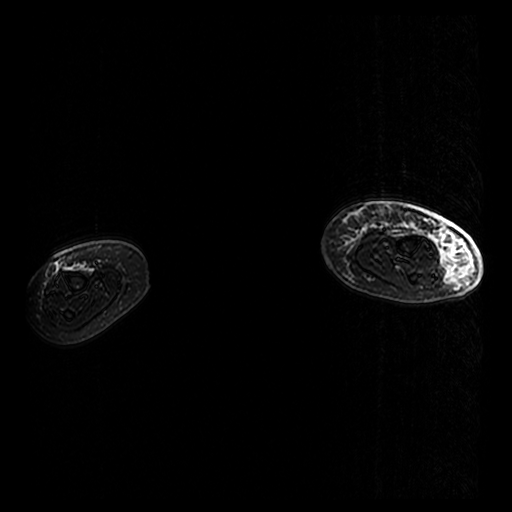
[im 55/74]
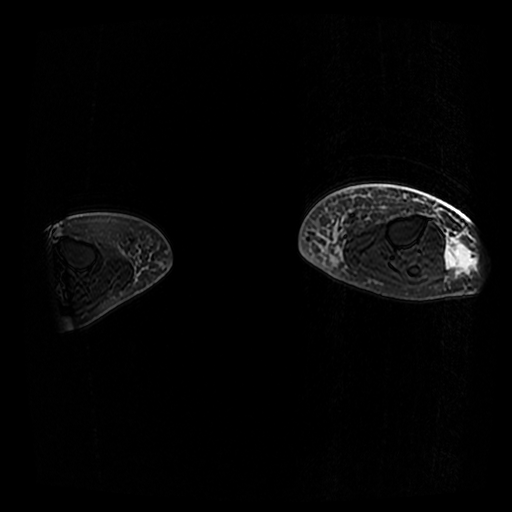
[im 74/74]
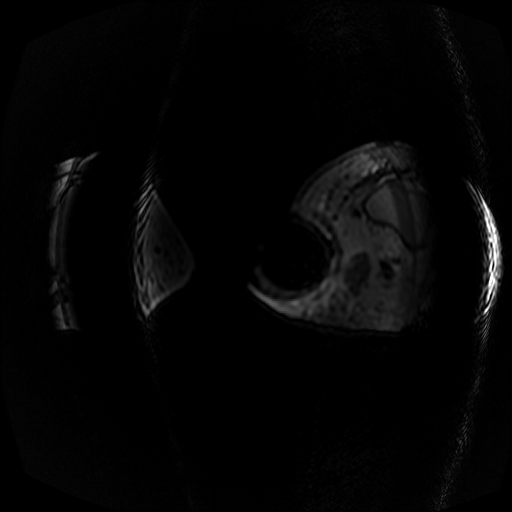

[19 of 40 positions shown; findings below may reference images not displayed]

FINDINGS: Degenerative spurring in the medial and lateral compartments of the
knee with loss of articular space. No findings of osteomyelitis
involving the left tibia or fibula.

There is asymmetric subcutaneous edema along the calf, especially
confluent anterolaterally and in the mid to distal calf. In the
distal calf approaching the ankle, the edema becomes more
circumferential. The edema does appear confined to the superficial
fascia all regions/subcutaneous tissues, without abnormal edema
tracking within her along the muscular tissues or deep fascia planes
of the calf to favor myositis or fasciitis. In addition, there is
only very low-grade enhancement in the subcutaneous space common not
entirely specific for cellulitis versus other causes of subcutaneous
edema, although clearly from the clinical standpoint cellulitis is
favored given the progression and clinical signs/symptoms. There
does appear to be asymmetry of the cutaneous tissues, with
thickening on the left.
IMPRESSION: 1. Asymmetric subcutaneous edema in the left calf, with only very
faint associated enhancement, and with associated cutaneous
thickening, suspicious for cellulitis given the clinical scenario.
No abscess, osteomyelitis, myositis, or abnormal edema or
enhancement along the deep fascia planes is identified to suggest
myositis or fasciitis. No gas is observed in the soft tissues.

## 2017-01-23 IMAGING — CT CT ANGIO CHEST
2 of 6 series · 19 of 36 positions shown · IV contrast (Omni 300)
Comparison: Chest x-ray 06/15/2015 and CT angiogram dated
10/08/2013

CLINICAL DATA: Extreme shortness of breath.

EXAM:
CT ANGIOGRAPHY CHEST WITH CONTRAST
TECHNIQUE: Multidetector CT imaging of the chest was performed using the
standard protocol during bolus administration of intravenous
contrast. Multiplanar CT image reconstructions and MIPs were
obtained to evaluate the vascular anatomy.
CONTRAST:  100 cc Isovue 370

[Series 8: pe thins · axial · 0.69mm/px · z∈[+1182,+1457]mm · 18 of 608 slices shown]
[im 29/608  lung]
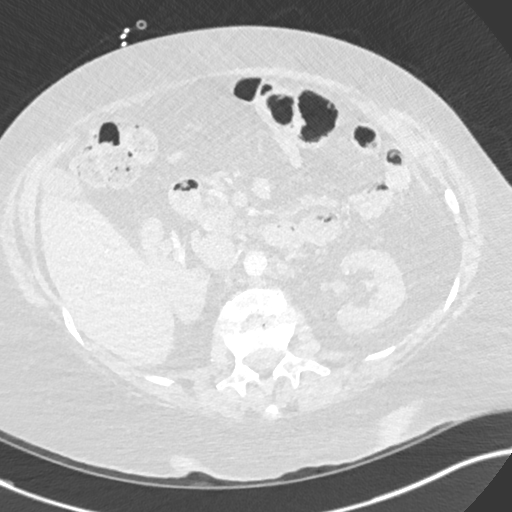
[im 58/608  mediastinal]
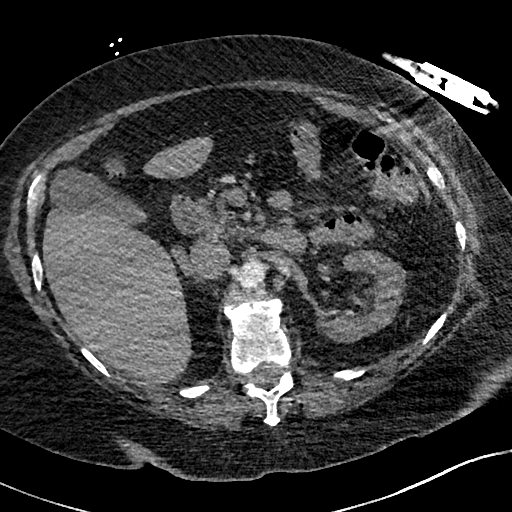
[im 87/608  lung]
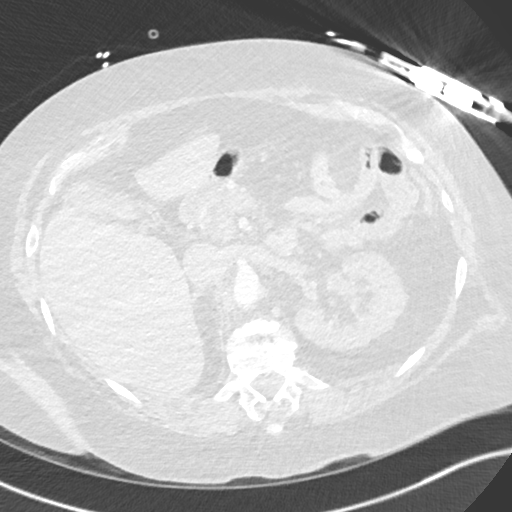
[im 116/608  mediastinal]
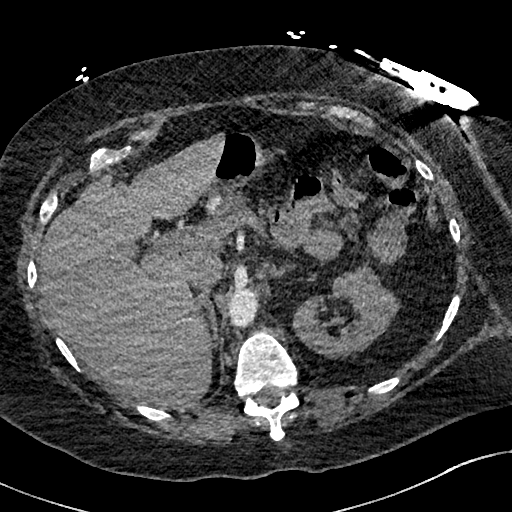
[im 174/608  lung]
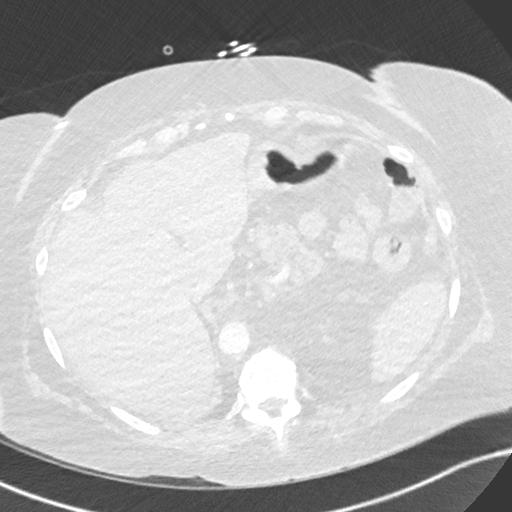
[im 203/608  mediastinal]
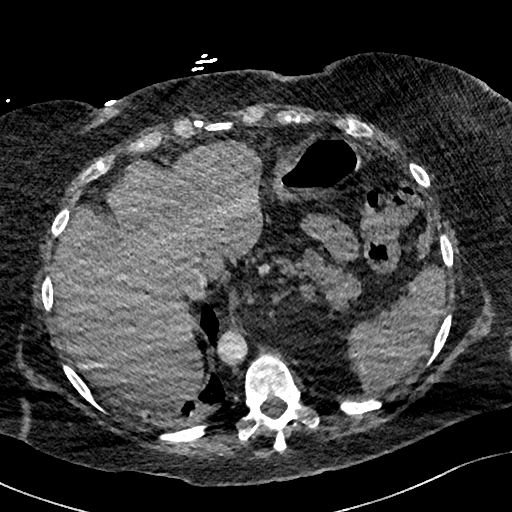
[im 232/608  lung]
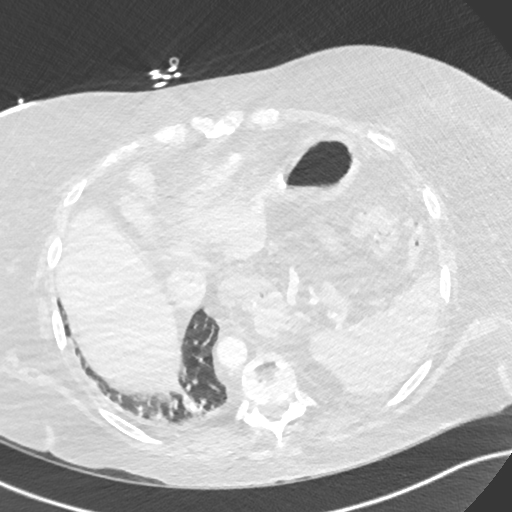
[im 261/608  mediastinal]
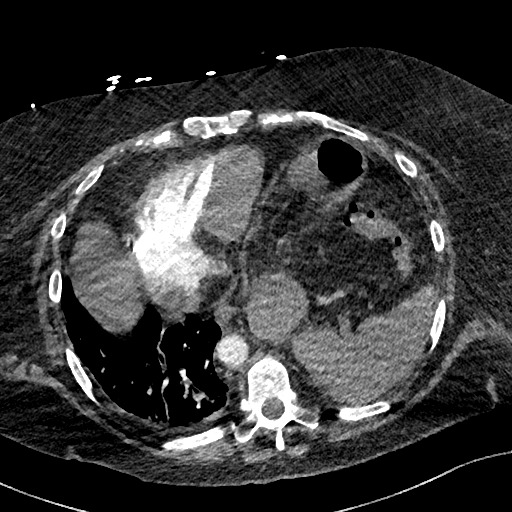
[im 290/608  lung]
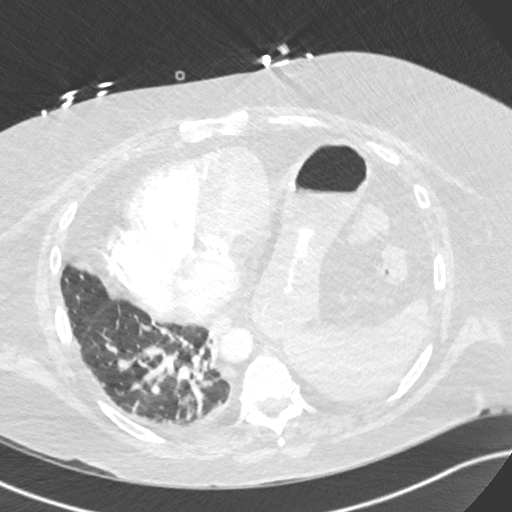
[im 318/608  mediastinal]
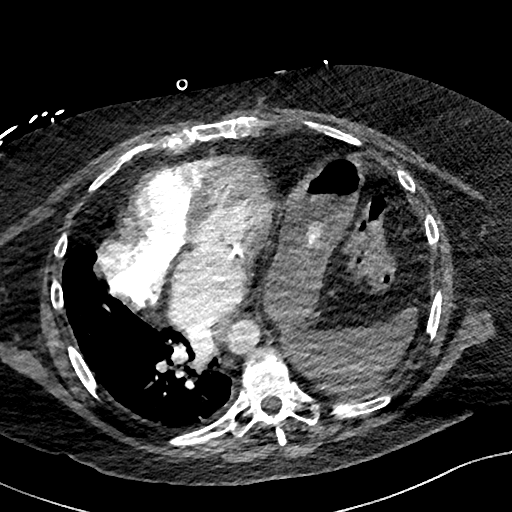
[im 347/608  lung]
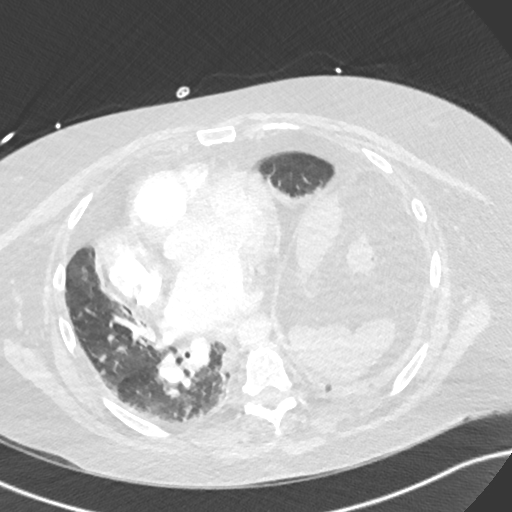
[im 376/608  mediastinal]
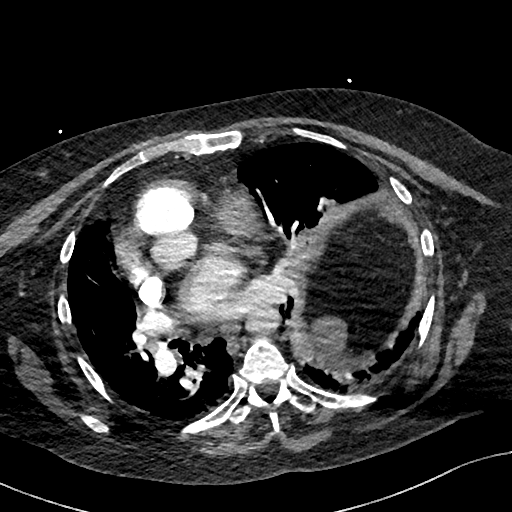
[im 405/608  lung]
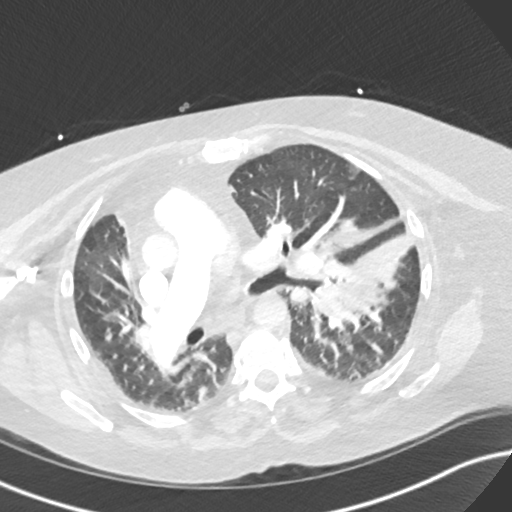
[im 463/608  mediastinal]
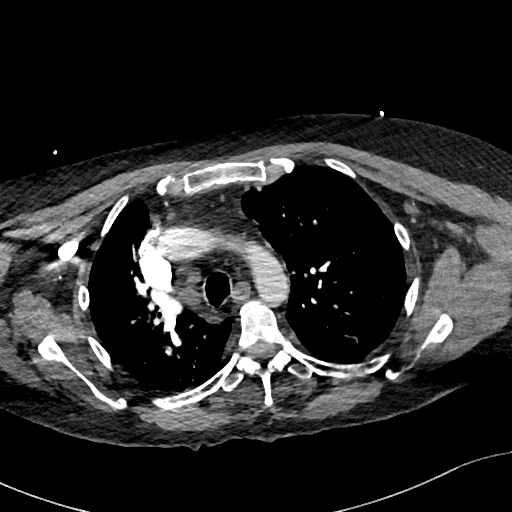
[im 492/608  lung]
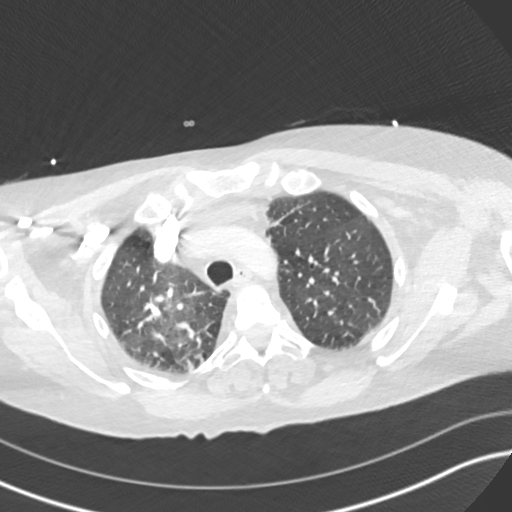
[im 521/608  mediastinal]
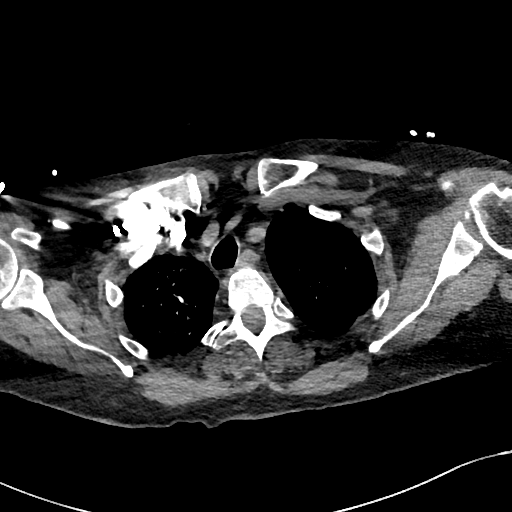
[im 550/608  lung]
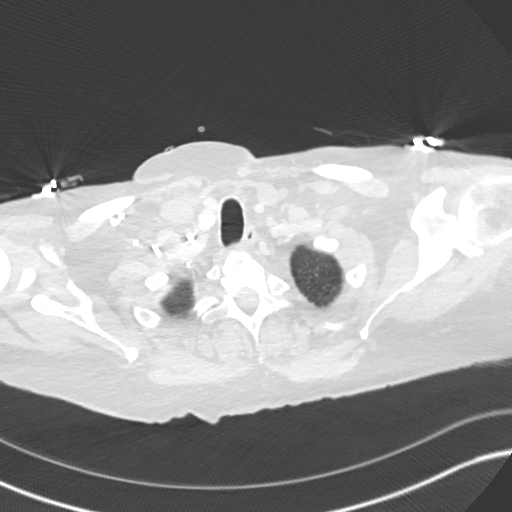
[im 579/608  mediastinal]
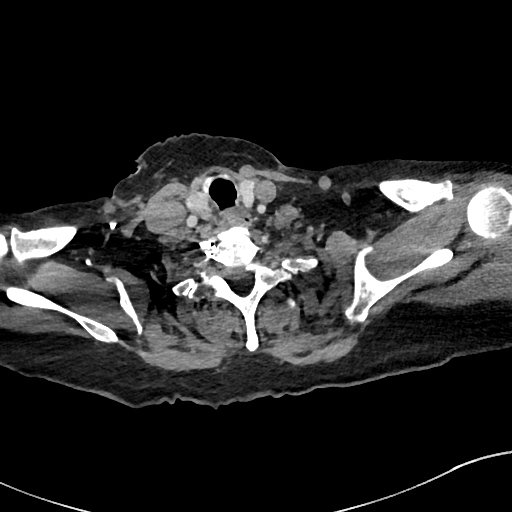

[Series 9: pe 2mm cor · coronal · 0.59mm/px · 1 of 125 slices shown]
[im 63/125  mediastinal]
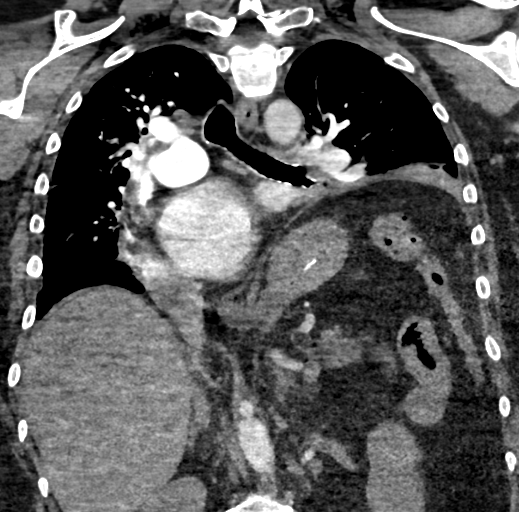

[19 of 36 positions shown; findings below may reference images not displayed]

FINDINGS: Mediastinum/Lymph Nodes: No pulmonary emboli or thoracic aortic
dissection identified. No masses or pathologically enlarged lymph
nodes identified.

Lungs/Pleura: There is increased elevation of the chronically
elevated left hemidiaphragm with further compression of the left
lung. There is slight atelectasis at the right lung base posteriorly
and there are small patchy areas of mosaic pattern increased density
in the right lung which can be seen with small airway disease.

Upper abdomen: No acute findings.

Musculoskeletal: No chest wall mass or suspicious bone lesions
identified.

Review of the MIP images confirms the above findings.
IMPRESSION: 1. No acute pulmonary emboli.
2. Mosaic appearance in the right lung which can be seen with small
airway or small vessel disease. This is new since the prior exam.
3. Increased chronic elevation of the left hemidiaphragm with
secondary increased compression of the left lung.

## 2018-02-09 DEATH — deceased
